# Patient Record
Sex: Female | Born: 1965 | Hispanic: No | Marital: Married | Smoking: Never smoker
Health system: Southern US, Community
[De-identification: ages and names within clinical notes are randomized; demographics above are authoritative.]

## PROBLEM LIST (undated history)

## (undated) DIAGNOSIS — M797 Fibromyalgia: Secondary | ICD-10-CM

## (undated) DIAGNOSIS — F419 Anxiety disorder, unspecified: Secondary | ICD-10-CM

## (undated) DIAGNOSIS — K259 Gastric ulcer, unspecified as acute or chronic, without hemorrhage or perforation: Secondary | ICD-10-CM

## (undated) HISTORY — DX: Fibromyalgia: M79.7

## (undated) HISTORY — DX: Gastric ulcer, unspecified as acute or chronic, without hemorrhage or perforation: K25.9

## (undated) HISTORY — DX: Anxiety disorder, unspecified: F41.9

## (undated) HISTORY — PX: TUBAL LIGATION: SHX77

---

## 2011-01-30 ENCOUNTER — Ambulatory Visit: Payer: Self-pay | Admitting: Internal Medicine

## 2011-07-09 ENCOUNTER — Ambulatory Visit: Payer: Self-pay | Admitting: Internal Medicine

## 2011-07-31 ENCOUNTER — Encounter: Payer: Self-pay | Admitting: Internal Medicine

## 2011-07-31 ENCOUNTER — Ambulatory Visit (INDEPENDENT_AMBULATORY_CARE_PROVIDER_SITE_OTHER): Payer: BC Managed Care – PPO | Admitting: Internal Medicine

## 2011-07-31 VITALS — BP 102/66 | HR 65 | Temp 98.1°F | Resp 20 | Ht 63.0 in | Wt 159.0 lb

## 2011-07-31 DIAGNOSIS — N952 Postmenopausal atrophic vaginitis: Secondary | ICD-10-CM

## 2011-07-31 DIAGNOSIS — F4320 Adjustment disorder, unspecified: Secondary | ICD-10-CM

## 2011-07-31 DIAGNOSIS — IMO0002 Reserved for concepts with insufficient information to code with codable children: Secondary | ICD-10-CM

## 2011-07-31 MED ORDER — ESTRADIOL 0.1 MG/GM VA CREA: 1.0000 g | TOPICAL_CREAM | Freq: Every day | VAGINAL | Status: AC

## 2011-07-31 NOTE — Patient Instructions (Addendum)
Apply estrace nightly x2 weeks, then 1-2 times per week. We will request previous office notes and lab work. Follow up in 1 month.

## 2011-07-31 NOTE — Progress Notes (Signed)
Subjective:    Patient ID: Carolyn Thomas, female    DOB: 01/23/1966, 45 y.o.   MRN: [id number]  HPI Carolyn Thomas is a 45 year old female who presents to establish care. Her main concern today is vaginal pain and dyspareunia. She reports that this has been present for several months. She was evaluated by her previous primary care physician and by OB/GYN in the past and she reports that examination, lab work, and ultrasound were all normal. She has been having irregular and very frequent periods sometimes up to 3 periods per month. She notes pain during sexual intercourse and reports that there is a laceration in her vagina which is painful at present. She occasionally has itching or clear discharge. She denies any pelvic pain, fever, chills, or dysuria.  Carolyn Thomas also reports a recent history of increased tension between herself and her husband, associated with relocation from [place] to [place]. She reports that her and her husband are currently seeking counseling and this is scheduled for next week.  Outpatient Encounter Prescriptions as of 07/31/2011  Medication Sig Dispense Refill  . estradiol (ESTRACE) 0.1 MG/GM vaginal cream Place 1 g vaginally daily.  42.5 g  1  . Multiple Vitamin (MULTIVITAMIN) capsule Take 1 capsule by mouth daily.        . rizatriptan (MAXALT) 10 MG tablet Take 10 mg by mouth as needed. May repeat in 2 hours if needed         Review of Systems  Constitutional: Negative for fever, chills, appetite change, fatigue and unexpected weight change.  HENT: Negative for ear pain, congestion, sore throat, trouble swallowing, neck pain, voice change and sinus pressure.   Eyes: Negative for visual disturbance.  Respiratory: Negative for cough, shortness of breath, wheezing and stridor.   Cardiovascular: Negative for chest pain, palpitations and leg swelling.  Gastrointestinal: Negative for nausea, vomiting, abdominal pain, diarrhea, constipation, blood in stool, abdominal  distention and anal bleeding.  Genitourinary: Positive for vaginal pain, menstrual problem (irregular and frequent) and dyspareunia. Negative for dysuria, flank pain, vaginal bleeding, vaginal discharge and pelvic pain.  Musculoskeletal: Negative for myalgias, arthralgias and gait problem.  Skin: Negative for color change and rash.  Neurological: Negative for dizziness and headaches.  Hematological: Negative for adenopathy. Does not bruise/bleed easily.  Psychiatric/Behavioral: Negative for suicidal ideas, sleep disturbance and dysphoric mood. The patient is not nervous/anxious.    BP 102/66  Pulse 65  Temp(Src) 98.1 F (36.7 C) (Oral)  Resp 20  Ht 5\' 3"  (1.6 m)  Wt 159 lb (72.122 kg)  BMI 28.17 kg/m2  SpO2 95%  LMP 07/16/2011     Objective:   Physical Exam  Constitutional: She is oriented to person, place, and time. She appears well-developed and well-nourished. No distress.  HENT:  Head: Normocephalic and atraumatic.  Right Ear: External ear normal.  Left Ear: External ear normal.  Nose: Nose normal.  Mouth/Throat: Oropharynx is clear and moist. No oropharyngeal exudate.  Eyes: Conjunctivae and EOM are normal. Pupils are equal, round, and reactive to light. Right eye exhibits no discharge. Left eye exhibits no discharge. No scleral icterus.  Neck: Normal range of motion. Neck supple. No tracheal deviation present. No thyromegaly present.  Cardiovascular: Normal rate, regular rhythm, normal heart sounds and intact distal pulses.  Exam reveals no gallop and no friction rub.   No murmur heard. Pulmonary/Chest: Effort normal and breath sounds normal. No respiratory distress. She has no wheezes. She has no rales. She exhibits no tenderness.  Abdominal:  Soft. She exhibits no distension. There is no tenderness.  Genitourinary:    Pelvic exam was performed with patient prone. There is injury on the left labia. There is tenderness around the vagina. No erythema or bleeding around the  vagina. There are signs of injury around the vagina. No vaginal discharge found.       Two abrasions, one at [facility] and one to the patient's left medial to the labia minora  Musculoskeletal: Normal range of motion. She exhibits no edema and no tenderness.  Lymphadenopathy:    She has no cervical adenopathy.  Neurological: She is alert and oriented to person, place, and time. No cranial nerve deficit. She exhibits normal muscle tone. Coordination normal.  Skin: Skin is warm and dry. No rash noted. She is not diaphoretic. No erythema. No pallor.  Psychiatric: She has a normal mood and affect. Her behavior is normal. Judgment and thought content normal.          Assessment & Plan:  1. Atrophic vaginitis with abrasion - Exam consistent with atrophic vaginitis with abrasion likely from intercourse.  Will request notes on previous evaluation and workup. Will start topical estrace cream nightly x2 weeks, then 1-2 times per week. We discussed potential risks of using estrogen preparation and potential alternative of oral contraceptive pill with combo estrogen and progesterone, however she reports she has not tolerated this well in the past and would prefer not to use topical preparations.  2. Adjustment disorder - Pt with some increase in martial stress associated with recent move to [place]. She has already set up counseling for her and her husband and I encouraged her to follow through with this.

## 2011-09-03 ENCOUNTER — Ambulatory Visit: Payer: BC Managed Care – PPO | Admitting: Internal Medicine

## 2011-09-03 DIAGNOSIS — Z0289 Encounter for other administrative examinations: Secondary | ICD-10-CM

## 2012-02-23 IMAGING — US US PELV - US TRANSVAGINAL
1 series · 14 of 25 positions shown · non-contrast
Comparison: none

REASON FOR EXAM: abnormal menstrual cycles
COMMENTS:

[Series 1: us pelv - us transvaginal · 0.33mm/px · 14 of 45 slices shown]
[im 1/45]
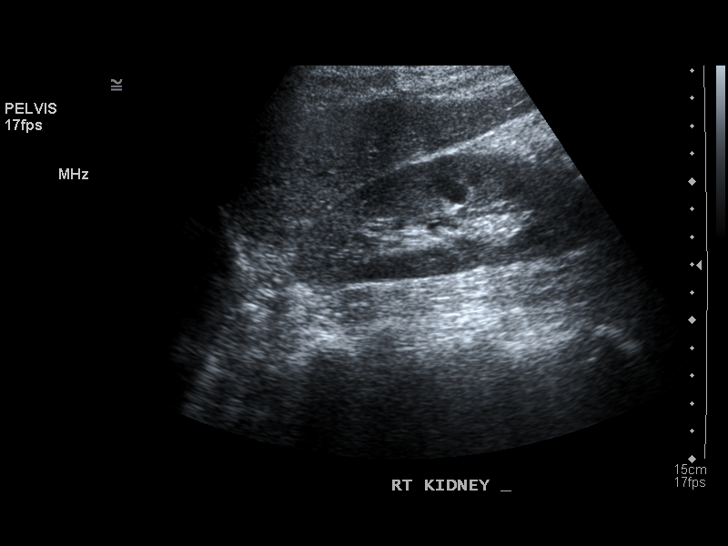
[im 4/45]
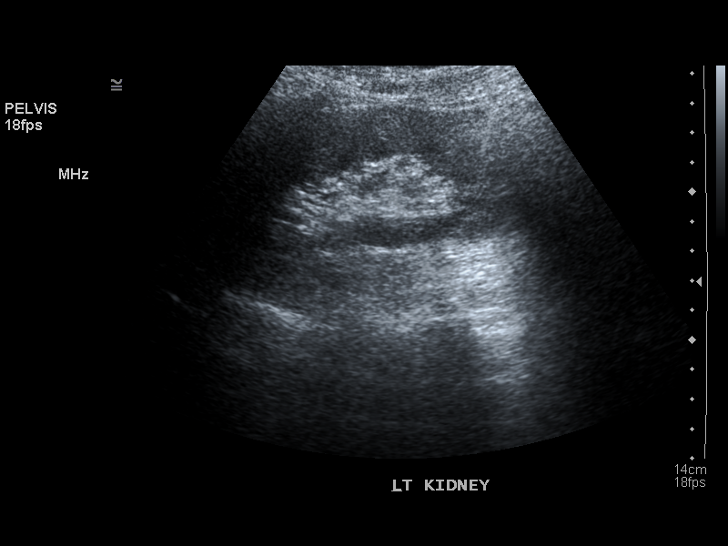
[im 8/45]
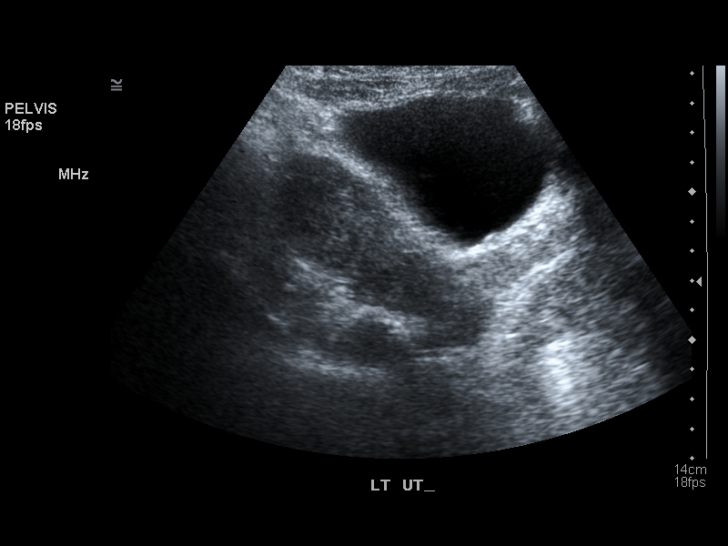
[im 12/45]
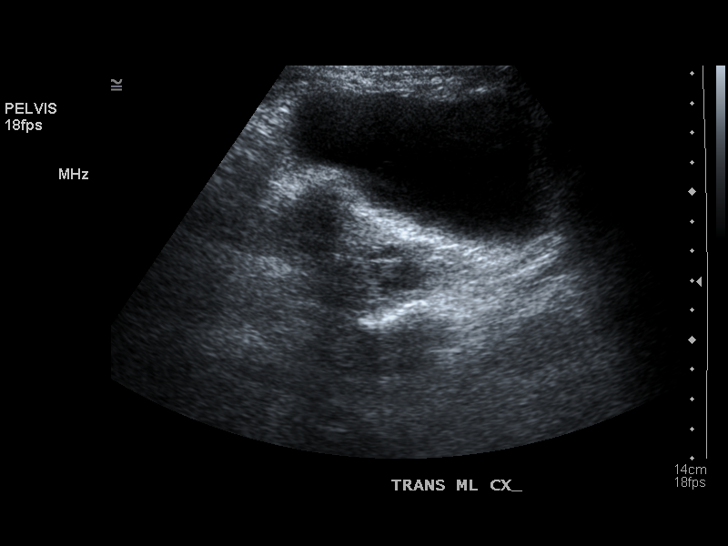
[im 15/45]
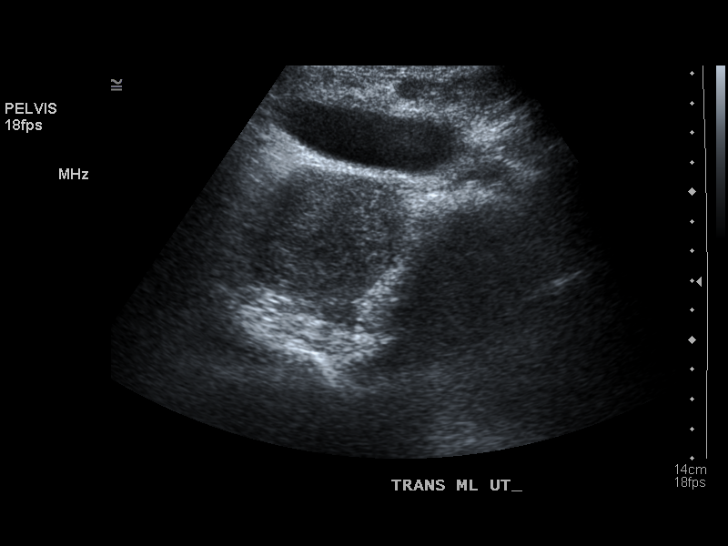
[im 17/45]
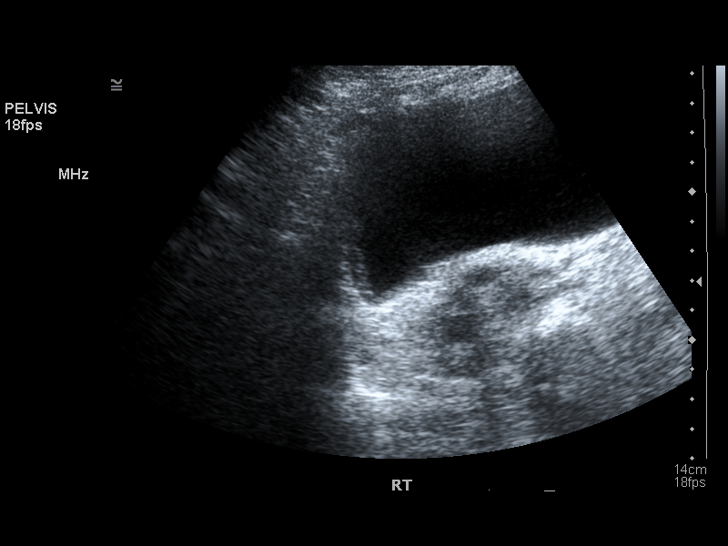
[im 21/45]
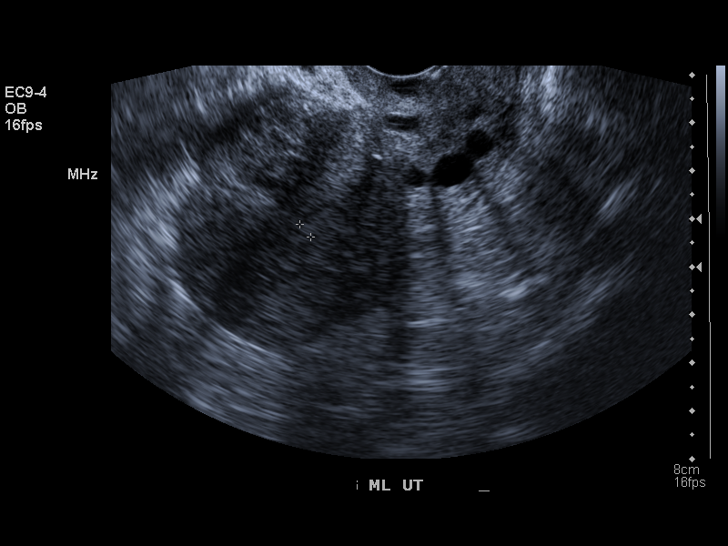
[im 24/45]
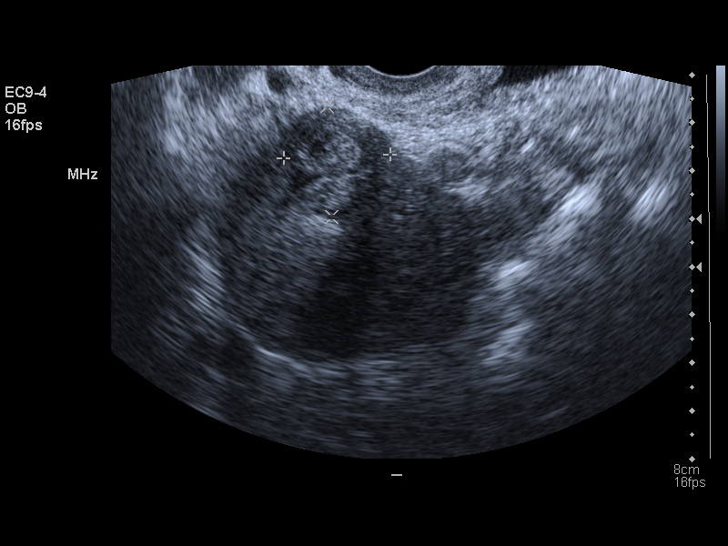
[im 28/45]
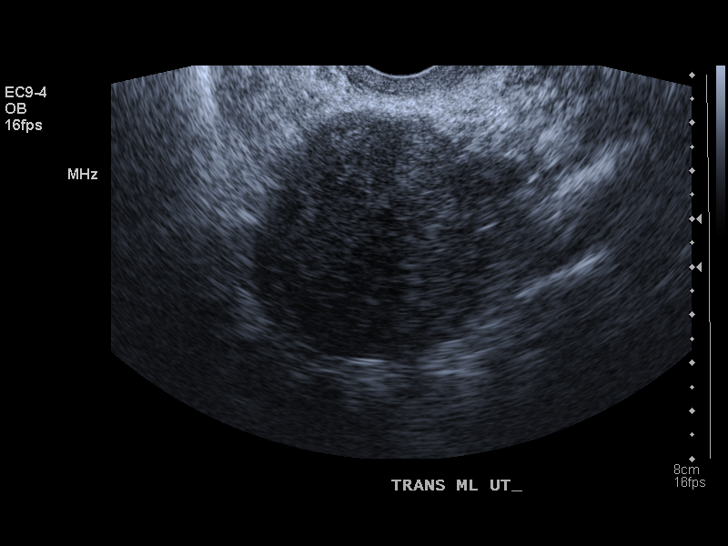
[im 30/45]
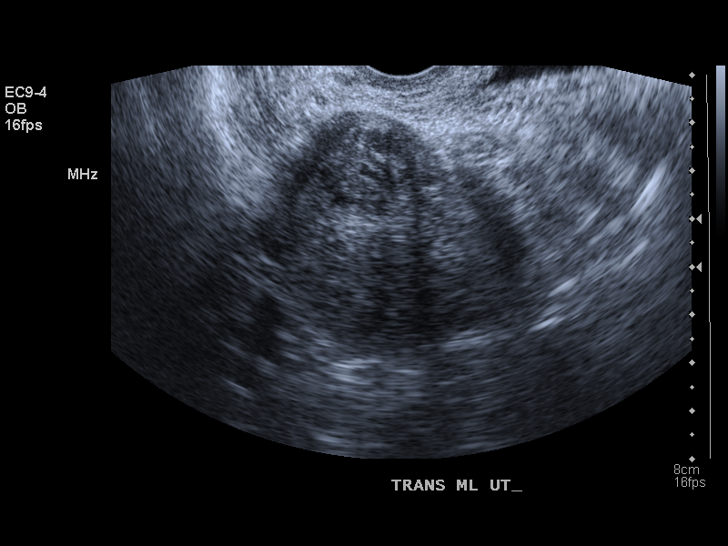
[im 34/45]
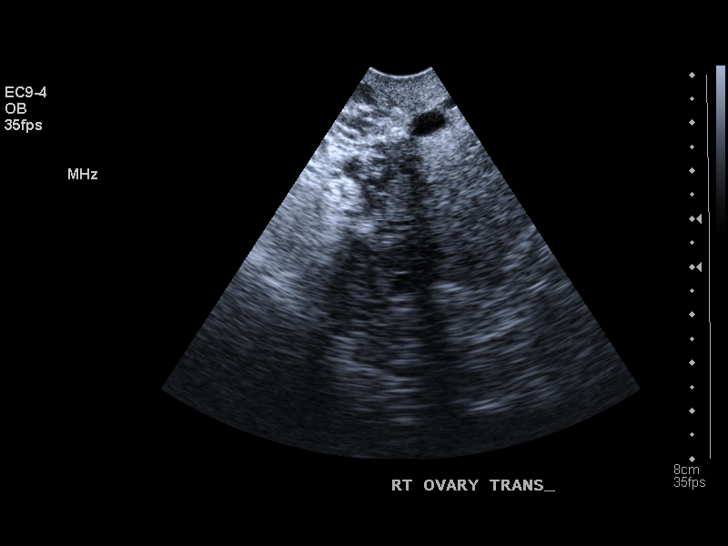
[im 37/45]
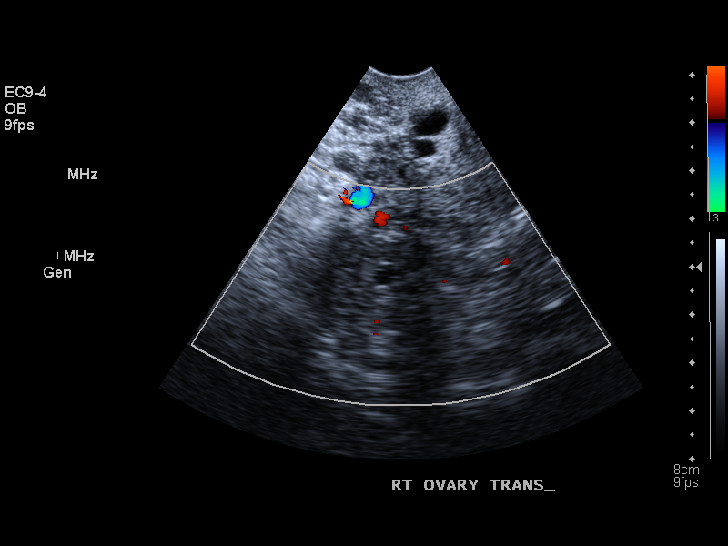
[im 41/45]
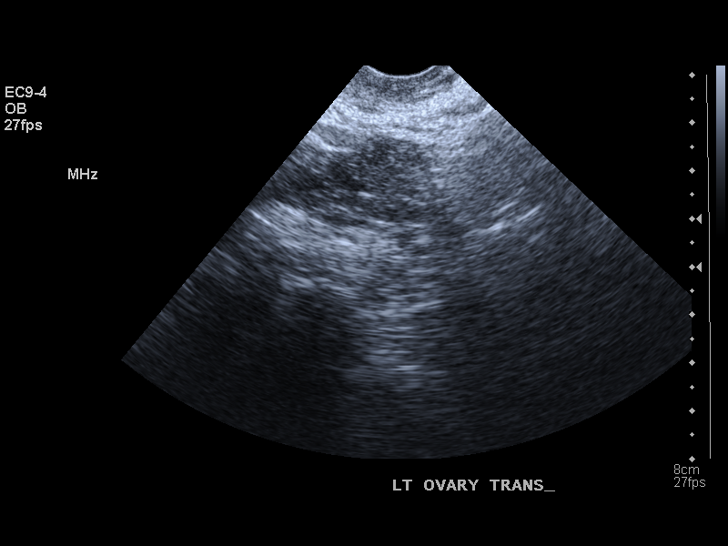
[im 45/45]
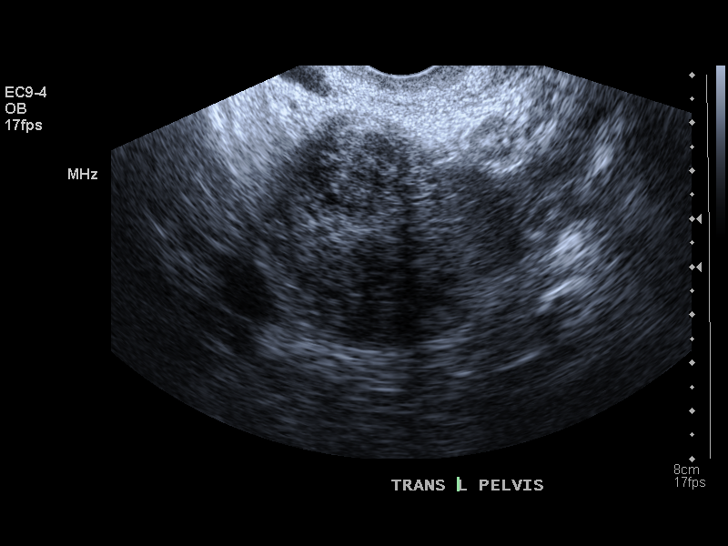

[14 of 25 positions shown; findings below may reference images not displayed]

PROCEDURE:     US  - US PELVIS EXAM W/TRANSVAGINAL  - January 30, 2011  [DATE]

RESULT:     Transabdominal and endovaginal ultrasound was performed. The
uterus measures 8.7 cm x 4.6 cm x 6.26 cm. There is a hypoechoic mass of the
body of the uterus anteriorly consistent with a uterine fibroid and
measuring 2.67 cm at maximum diameter. The endometrium measures 3.5 mm in
thickness. No abnormal adnexal masses are seen. The right and left ovaries
are visualized. The right ovary measures 2.54 cm at maximum diameter and the
left ovary measures 2.73 cm at maximum diameter. Vascular flow is observed
in each ovary on Doppler examination. The visualized portion of the urinary
bladder is normal in appearance. The kidneys show no hydronephrosis. No free
fluid is identified in the pelvis.
IMPRESSION: No significant abnormalities are identified.

## 2012-08-01 IMAGING — CR DG CHEST 2V
1 series · 2 of 2 positions shown · non-contrast
Comparison: none

REASON FOR EXAM: COUGH
COMMENTS:

PROCEDURE:     KDR - KDXR CHEST PA (OR AP) AND LAT  - July 09, 2011 [DATE]
RESULT:     The lung fields are clear. The heart, mediastinal and osseous
structures reveal no significant abnormalities.

[Series 1: view not recorded · 0.17mm/px · 2 of 2 slices shown]
[im 1/2]
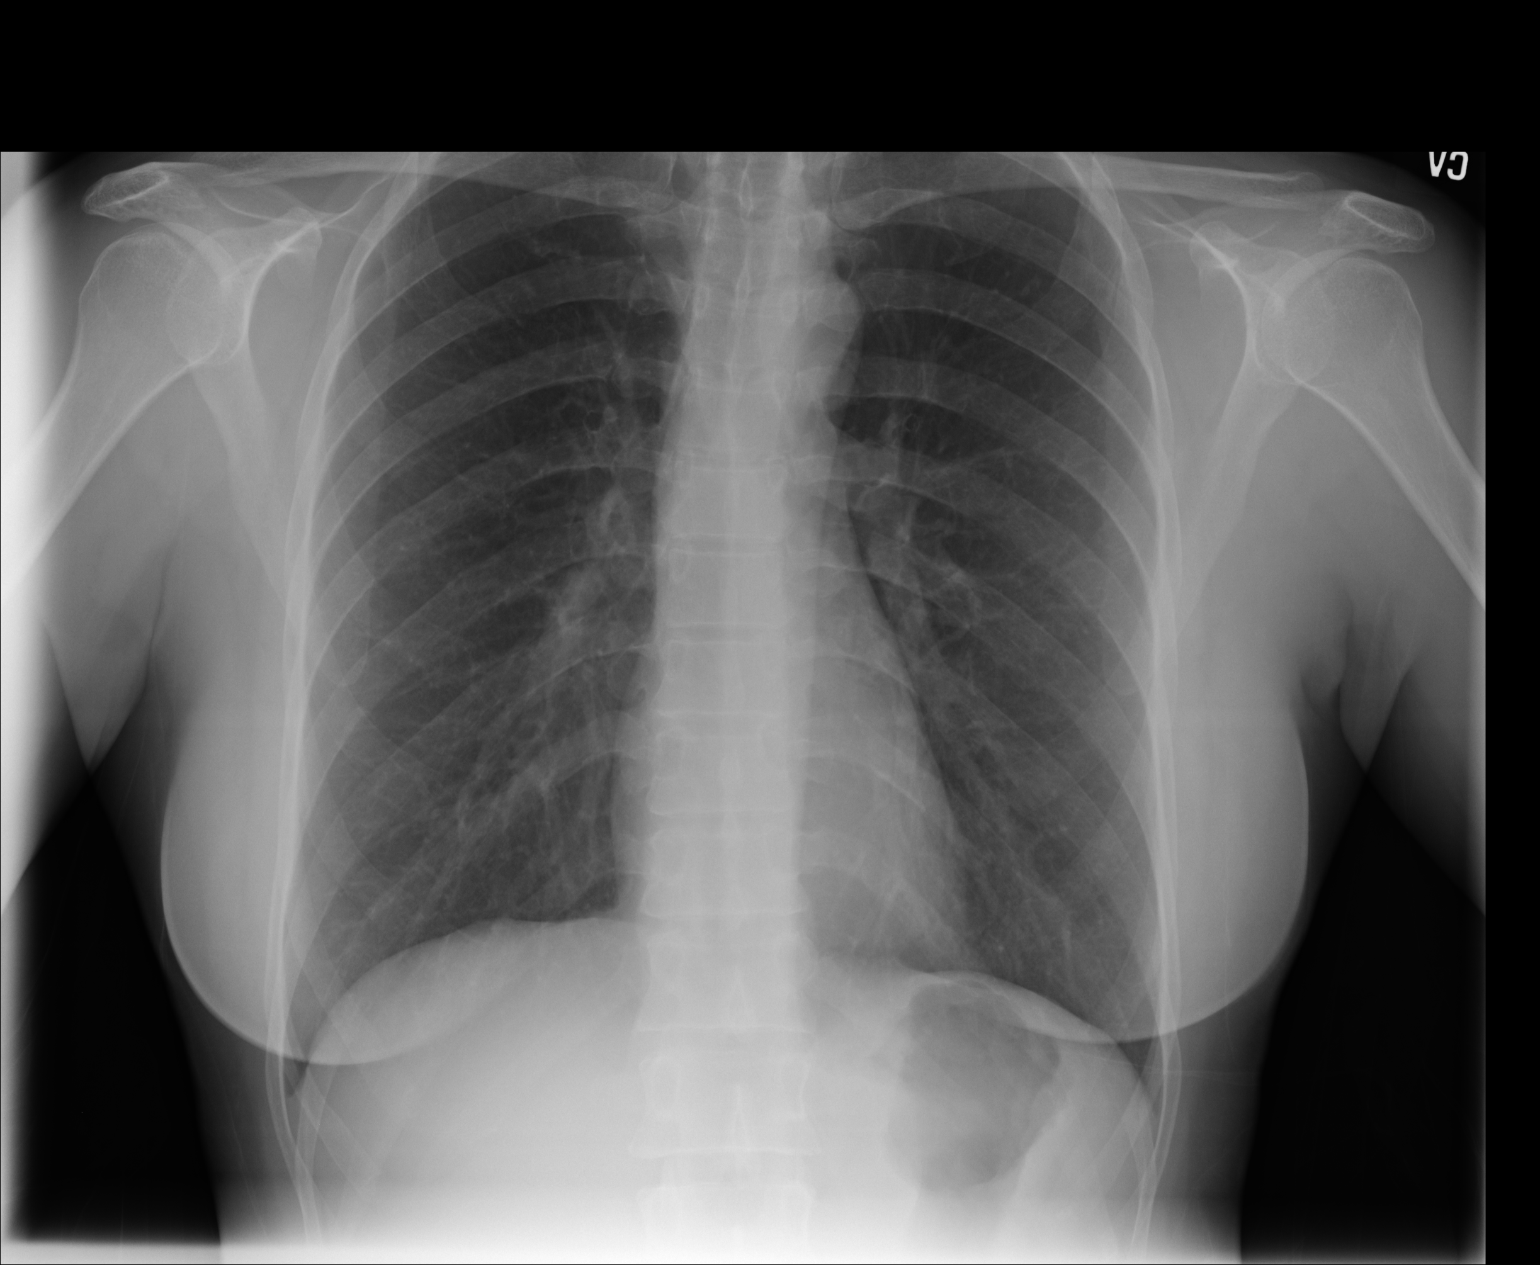
[im 2/2]
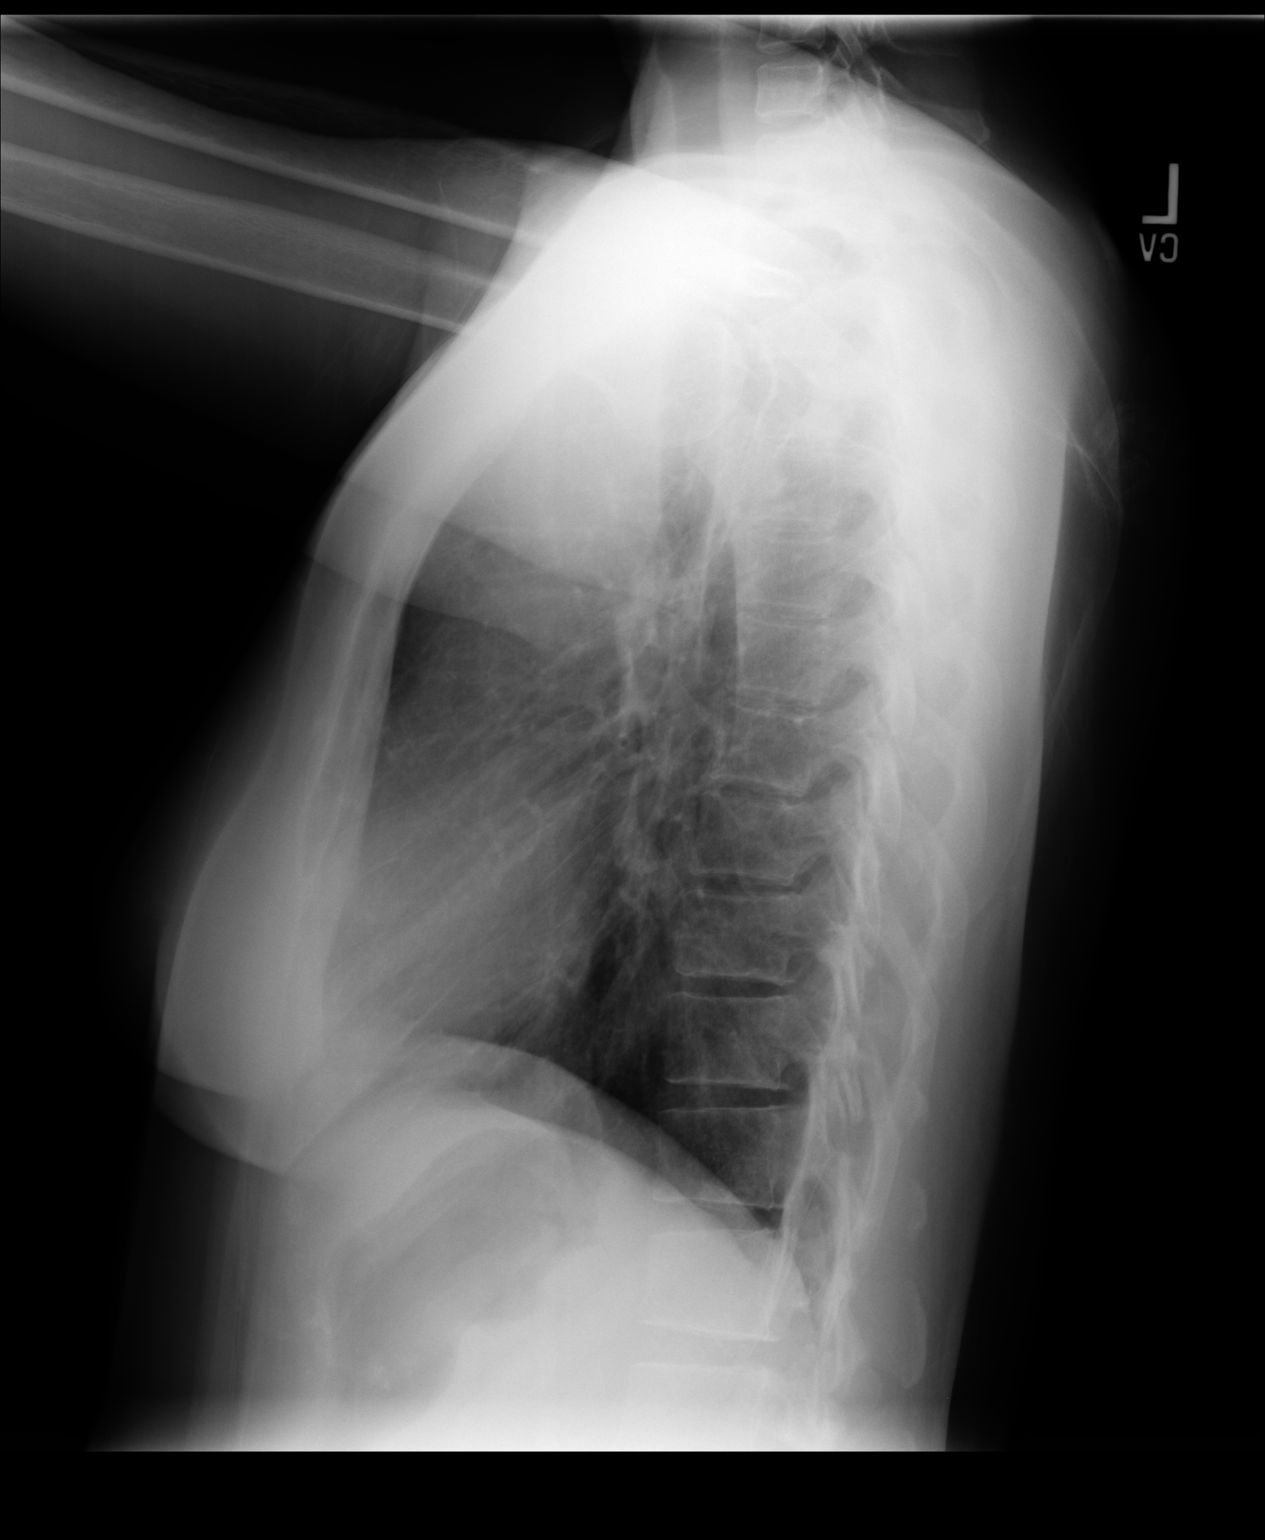

[2 of 2 positions shown; findings below may reference images not displayed]

IMPRESSION: No significant abnormalities are noted.

## 2012-09-08 ENCOUNTER — Encounter: Payer: Self-pay | Admitting: Internal Medicine

## 2012-09-08 ENCOUNTER — Ambulatory Visit (INDEPENDENT_AMBULATORY_CARE_PROVIDER_SITE_OTHER): Payer: BC Managed Care – PPO | Admitting: Internal Medicine

## 2012-09-08 VITALS — BP 102/60 | HR 62 | Ht 63.5 in | Wt 132.0 lb

## 2012-09-08 DIAGNOSIS — Z1239 Encounter for other screening for malignant neoplasm of breast: Secondary | ICD-10-CM

## 2012-09-08 DIAGNOSIS — Z13220 Encounter for screening for lipoid disorders: Secondary | ICD-10-CM

## 2012-09-08 DIAGNOSIS — M791 Myalgia, unspecified site: Secondary | ICD-10-CM

## 2012-09-08 DIAGNOSIS — Z0000 Encounter for general adult medical examination without abnormal findings: Secondary | ICD-10-CM

## 2012-09-08 DIAGNOSIS — IMO0001 Reserved for inherently not codable concepts without codable children: Secondary | ICD-10-CM

## 2012-09-08 LAB — CBC WITH DIFFERENTIAL/PLATELET
Eosinophils Relative: 2.8 % (ref 0.0–5.0)
HCT: 40.6 % (ref 36.0–46.0)
Hemoglobin: 13.4 g/dL (ref 12.0–15.0)
Lymphs Abs: 2.2 10*3/uL (ref 0.7–4.0)
MCV: 89.7 fl (ref 78.0–100.0)
Monocytes Absolute: 0.3 10*3/uL (ref 0.1–1.0)
Monocytes Relative: 5.3 % (ref 3.0–12.0)
Neutro Abs: 3.5 10*3/uL (ref 1.4–7.7)
Platelets: 253 10*3/uL (ref 150.0–400.0)
RDW: 13.5 % (ref 11.5–14.6)
WBC: 6.3 10*3/uL (ref 4.5–10.5)

## 2012-09-08 LAB — LDL CHOLESTEROL, DIRECT: Direct LDL: 138.2 mg/dL

## 2012-09-08 LAB — COMPREHENSIVE METABOLIC PANEL
ALT: 16 U/L (ref 0–35)
Alkaline Phosphatase: 66 U/L (ref 39–117)
Creatinine, Ser: 0.7 mg/dL (ref 0.4–1.2)
GFR: 95.73 mL/min (ref >60.00)
Sodium: 137 mEq/L (ref 135–145)
Total Bilirubin: 1.1 mg/dL (ref 0.3–1.2)
Total Protein: 6.7 g/dL (ref 6.0–8.3)

## 2012-09-08 LAB — CK: Total CK: 122 U/L (ref 7–177)

## 2012-09-08 LAB — LIPID PANEL
HDL: 55.6 mg/dL (ref >39.00)
Total CHOL/HDL Ratio: 4
Triglycerides: 97 mg/dL (ref 0.0–149.0)

## 2012-09-08 NOTE — Assessment & Plan Note (Addendum)
General medical exam normal today including breast exam. PAP deferred as recently completed at previous PCP and normal per pt. Will request records on this.  Flu vaccine declined today.  Mammogram ordered. Will check labs including CBC, CMP, lipids. Follow up 1 year and prn.

## 2012-09-08 NOTE — Assessment & Plan Note (Signed)
Mild myalgia noted in hips and thighs. No new medications or supplements noted. Exam normal. Will check CBC, CMP, total CK, TSH, B12 with labs. No arthralgia to suggest Lupus, RA.  Will continue to monitor. Pt will call if symptoms worsening or not improving.

## 2012-09-08 NOTE — Progress Notes (Signed)
Subjective:    Patient ID: Carolyn Thomas, female    DOB: [birth year], 46 y.o.   MRN: [id number]  HPI 46YO female presents for annual exam. Doing well. Only concern today is occasional muscle pain in bilateral hips and thighs. Does not take any medication for this.  Pain described as aching. Not associated with any particular activity. Not worsened with activity.  No weakness noted in legs or arms. No joint pain or skin changes.  Otherwise, feeling well. No recent issues with headaches, has not taken Maxalt since last visit.  Notes that PAP is UTD and was normal last year. Mammogram due.  Outpatient Encounter Prescriptions as of 09/08/2012  Medication Sig Dispense Refill  . Multiple Vitamin (MULTIVITAMIN) capsule Take 1 capsule by mouth daily.        . rizatriptan (MAXALT) 10 MG tablet Take 10 mg by mouth as needed. May repeat in 2 hours if needed        BP 102/60  Pulse 62  Ht 5' 3.5" (1.613 m)  Wt 132 lb (59.875 kg)  BMI 23.02 kg/m2  SpO2 98%  Review of Systems  Constitutional: Negative for fever, chills, appetite change, fatigue and unexpected weight change.  HENT: Negative for ear pain, congestion, sore throat, trouble swallowing, neck pain, voice change and sinus pressure.   Eyes: Negative for visual disturbance.  Respiratory: Negative for cough, shortness of breath, wheezing and stridor.   Cardiovascular: Negative for chest pain, palpitations and leg swelling.  Gastrointestinal: Negative for nausea, vomiting, abdominal pain, diarrhea, constipation, blood in stool, abdominal distention and anal bleeding.  Genitourinary: Negative for dysuria and flank pain.  Musculoskeletal: Positive for myalgias. Negative for arthralgias and gait problem.  Skin: Negative for color change and rash.  Neurological: Negative for dizziness, seizures, weakness, numbness and headaches.  Hematological: Negative for adenopathy. Does not bruise/bleed easily.  Psychiatric/Behavioral: Negative for suicidal  ideas, disturbed wake/sleep cycle and dysphoric mood. The patient is not nervous/anxious.        Objective:   Physical Exam  Constitutional: She is oriented to person, place, and time. She appears well-developed and well-nourished. No distress.  HENT:  Head: Normocephalic and atraumatic.  Right Ear: External ear normal.  Left Ear: External ear normal.  Nose: Nose normal.  Mouth/Throat: Oropharynx is clear and moist. No oropharyngeal exudate.  Eyes: Conjunctivae normal are normal. Pupils are equal, round, and reactive to light. Right eye exhibits no discharge. Left eye exhibits no discharge. No scleral icterus.  Neck: Normal range of motion. Neck supple. No tracheal deviation present. No thyromegaly present.  Cardiovascular: Normal rate, regular rhythm, normal heart sounds and intact distal pulses.  Exam reveals no gallop and no friction rub.   No murmur heard. Pulmonary/Chest: Effort normal and breath sounds normal. No respiratory distress. She has no wheezes. She has no rales. She exhibits no tenderness. Right breast exhibits no inverted nipple, no mass, no nipple discharge, no skin change and no tenderness. Left breast exhibits no inverted nipple, no mass, no nipple discharge, no skin change and no tenderness. Breasts are symmetrical.  Abdominal: Soft. Bowel sounds are normal. She exhibits no distension. There is no tenderness.  Musculoskeletal: Normal range of motion. She exhibits no edema and no tenderness.  Lymphadenopathy:    She has no cervical adenopathy.  Neurological: She is alert and oriented to person, place, and time. No cranial nerve deficit. She exhibits normal muscle tone. Coordination normal.  Skin: Skin is warm and dry. No rash noted. She is not diaphoretic.  No erythema. No pallor.  Psychiatric: She has a normal mood and affect. Her behavior is normal. Judgment and thought content normal.          Assessment & Plan:

## 2012-09-11 ENCOUNTER — Telehealth: Payer: Self-pay | Admitting: Internal Medicine

## 2012-09-11 DIAGNOSIS — M791 Myalgia, unspecified site: Secondary | ICD-10-CM

## 2012-09-11 NOTE — Telephone Encounter (Signed)
Referral placed to rheumatology for muscle pain

## 2012-09-25 ENCOUNTER — Telehealth: Payer: Self-pay | Admitting: Internal Medicine

## 2012-09-25 NOTE — Telephone Encounter (Signed)
Left message on cell phone voicemail for patient to return call. 

## 2012-09-25 NOTE — Telephone Encounter (Signed)
Mammogram performed on 09/23/2012 needed additional views. Can you please make sure this has been set up?

## 2012-09-28 ENCOUNTER — Telehealth: Payer: Self-pay | Admitting: Internal Medicine

## 2012-09-28 NOTE — Telephone Encounter (Signed)
Patient advised via telephone, she stated that she will call [facility] today to schedule additional views.  I advised patient to call our office once the additional views have been done to schedule a f/u appt with Dr. Dan Humphreys.  I provided patient the phone number to [facility] [phone].

## 2012-09-28 NOTE — Telephone Encounter (Signed)
Compression views on mammogram were normal. Recommend follow up mammogram in 1 year. No need for appointment to discuss this unless pt has questions

## 2012-09-28 NOTE — Telephone Encounter (Signed)
Left message on cell phone voicemail for patient to return call. 

## 2012-09-29 NOTE — Telephone Encounter (Signed)
Notified patient of normal mammogram results.

## 2012-10-05 ENCOUNTER — Encounter: Payer: Self-pay | Admitting: Internal Medicine

## 2012-10-12 ENCOUNTER — Encounter: Payer: Self-pay | Admitting: Internal Medicine

## 2013-09-10 ENCOUNTER — Encounter: Payer: Self-pay | Admitting: Internal Medicine

## 2013-09-10 ENCOUNTER — Encounter (INDEPENDENT_AMBULATORY_CARE_PROVIDER_SITE_OTHER): Payer: Self-pay

## 2013-09-10 ENCOUNTER — Ambulatory Visit (INDEPENDENT_AMBULATORY_CARE_PROVIDER_SITE_OTHER): Payer: BC Managed Care – PPO | Admitting: Internal Medicine

## 2013-09-10 VITALS — BP 100/72 | HR 78 | Temp 98.3°F | Ht 63.25 in | Wt 134.0 lb

## 2013-09-10 DIAGNOSIS — L989 Disorder of the skin and subcutaneous tissue, unspecified: Secondary | ICD-10-CM

## 2013-09-10 DIAGNOSIS — Z0000 Encounter for general adult medical examination without abnormal findings: Secondary | ICD-10-CM

## 2013-09-10 LAB — LIPID PANEL
Cholesterol: 200 mg/dL (ref 0–200)
LDL Cholesterol: 122 mg/dL — ABNORMAL HIGH (ref 0–99)
Total CHOL/HDL Ratio: 3 Ratio
Triglycerides: 54 mg/dL (ref <150)
VLDL: 11 mg/dL (ref 0–40)

## 2013-09-10 LAB — CBC WITH DIFFERENTIAL/PLATELET
HCT: 40.6 % (ref 36.0–46.0)
Hemoglobin: 13.9 g/dL (ref 12.0–15.0)
Lymphocytes Relative: 38 % (ref 12–46)
Lymphs Abs: 3.4 10*3/uL (ref 0.7–4.0)
Monocytes Relative: 4 % (ref 3–12)
Neutro Abs: 5.1 10*3/uL (ref 1.7–7.7)
Neutrophils Relative %: 56 % (ref 43–77)
RBC: 4.72 MIL/uL (ref 3.87–5.11)

## 2013-09-10 LAB — COMPREHENSIVE METABOLIC PANEL
Albumin: 4.4 g/dL (ref 3.5–5.2)
BUN: 16 mg/dL (ref 6–23)
Calcium: 9.7 mg/dL (ref 8.4–10.5)
Chloride: 102 mEq/L (ref 96–112)
Creat: 0.74 mg/dL (ref 0.50–1.10)
Glucose, Bld: 68 mg/dL — ABNORMAL LOW (ref 70–99)
Potassium: 4.4 mEq/L (ref 3.5–5.3)

## 2013-09-10 LAB — TSH: TSH: 2.254 u[IU]/mL (ref 0.350–4.500)

## 2013-09-11 DIAGNOSIS — Z0000 Encounter for general adult medical examination without abnormal findings: Secondary | ICD-10-CM | POA: Insufficient documentation

## 2013-09-11 DIAGNOSIS — L989 Disorder of the skin and subcutaneous tissue, unspecified: Secondary | ICD-10-CM | POA: Insufficient documentation

## 2013-09-11 LAB — VITAMIN D 25 HYDROXY (VIT D DEFICIENCY, FRACTURES): Vit D, 25-Hydroxy: 29 ng/mL — ABNORMAL LOW (ref 30–89)

## 2013-09-11 NOTE — Assessment & Plan Note (Addendum)
General medical exam including breast exam normal today. Pap and pelvic deferred as reportedly normal 2012. Will request records on Pap smear from her former PCP. Mammogram ordered. Will check labs today including CBC, CMP, lipid profile, TSH, vitamin D. Flu vaccine declined.

## 2013-09-11 NOTE — Progress Notes (Signed)
Subjective:    Patient ID: Carolyn Thomas, female    DOB: 11/11/1966, 47 y.o.   MRN: [id number]  HPI 47 year old female presents for annual exam. She reports she is feeling well. She has been following a healthy diet and gets regular physical activity. She denies any repeat episodes of migraine headaches since her last visit. She declines flu shot today. She is up-to-date on Pap smear which she reports was normal in 2012. She is due for mammogram. She is concerned about a new skin lesion on her right foot. She reports this first appeared a few months ago. It appears to be dark black especially when it is wet after showering. It is not painful. It has increased in size.  Outpatient Encounter Prescriptions as of 09/10/2013  Medication Sig  . Multiple Vitamin (MULTIVITAMIN) capsule Take 1 capsule by mouth daily.     BP 100/72  Pulse 78  Temp(Src) 98.3 F (36.8 C) (Oral)  Ht 5' 3.25" (1.607 m)  Wt 134 lb (60.782 kg)  BMI 23.54 kg/m2  SpO2 97%  Review of Systems  Constitutional: Negative for fever, chills, appetite change, fatigue and unexpected weight change.  HENT: Negative for congestion, ear pain, sinus pressure, sore throat, trouble swallowing and voice change.   Eyes: Negative for visual disturbance.  Respiratory: Negative for cough, shortness of breath, wheezing and stridor.   Cardiovascular: Negative for chest pain, palpitations and leg swelling.  Gastrointestinal: Negative for nausea, vomiting, abdominal pain, diarrhea, constipation, blood in stool, abdominal distention and anal bleeding.  Genitourinary: Negative for dysuria and flank pain.  Musculoskeletal: Negative for arthralgias, gait problem, myalgias and neck pain.  Skin: Negative for color change and rash.  Neurological: Negative for dizziness and headaches.  Hematological: Negative for adenopathy. Does not bruise/bleed easily.  Psychiatric/Behavioral: Negative for suicidal ideas, sleep disturbance and dysphoric mood. The  patient is not nervous/anxious.        Objective:   Physical Exam  Constitutional: She is oriented to person, place, and time. She appears well-developed and well-nourished. No distress.  HENT:  Head: Normocephalic and atraumatic.  Right Ear: External ear normal.  Left Ear: External ear normal.  Nose: Nose normal.  Mouth/Throat: Oropharynx is clear and moist. No oropharyngeal exudate.  Eyes: Conjunctivae are normal. Pupils are equal, round, and reactive to light. Right eye exhibits no discharge. Left eye exhibits no discharge. No scleral icterus.  Neck: Normal range of motion. Neck supple. No tracheal deviation present. No thyromegaly present.  Cardiovascular: Normal rate, regular rhythm, normal heart sounds and intact distal pulses.  Exam reveals no gallop and no friction rub.   No murmur heard. Pulmonary/Chest: Effort normal and breath sounds normal. No accessory muscle usage. Not tachypneic. No respiratory distress. She has no decreased breath sounds. She has no wheezes. She has no rales. She exhibits no tenderness. Right breast exhibits no inverted nipple, no mass, no nipple discharge, no skin change and no tenderness. Left breast exhibits no inverted nipple, no mass, no nipple discharge, no skin change and no tenderness. Breasts are symmetrical.  Abdominal: Soft. Bowel sounds are normal. She exhibits no distension and no mass. There is no tenderness. There is no rebound and no guarding.  Musculoskeletal: Normal range of motion. She exhibits no edema and no tenderness.  Lymphadenopathy:    She has no cervical adenopathy.  Neurological: She is alert and oriented to person, place, and time. No cranial nerve deficit. She exhibits normal muscle tone. Coordination normal.  Skin: Skin is warm and dry.  Lesion noted. No rash noted. She is not diaphoretic. No erythema. No pallor.     Psychiatric: She has a normal mood and affect. Her behavior is normal. Judgment and thought content normal.           Assessment & Plan:

## 2013-09-11 NOTE — Assessment & Plan Note (Signed)
Skin lesion on right foot most consistent with seborrheic keratosis. Will set up dermatology evaluation for overall skin exam and possible removal of SK.

## 2013-09-13 ENCOUNTER — Encounter: Payer: Self-pay | Admitting: *Deleted

## 2013-09-15 ENCOUNTER — Telehealth: Payer: Self-pay | Admitting: Internal Medicine

## 2013-09-15 ENCOUNTER — Encounter: Payer: Self-pay | Admitting: Emergency Medicine

## 2013-09-15 NOTE — Telephone Encounter (Signed)
Left message to call back  

## 2013-09-15 NOTE — Telephone Encounter (Signed)
Having a lot of aches and pains not resolved with tylenol.  States right now she is in a lot of pain.  Was recently here 10/31 but forgot to mention this.  Asking if Dr. Dan Humphreys will give her advice over the phone, call something in for her, or if she will have to come back in and be seen.

## 2013-09-16 NOTE — Telephone Encounter (Signed)
Spoke with patient she state she has been having a lot of body aches and pain. Informed patient she would need to schedule an appointment to discuss. Per patient she will call back to schedule.

## 2013-11-19 ENCOUNTER — Telehealth: Payer: Self-pay | Admitting: Internal Medicine

## 2013-11-19 NOTE — Telephone Encounter (Signed)
Spoke with patient, informed her we do not have any immunizations on file for her. Patient verbally agreed and stated she tracked some down from her previous physicians.

## 2013-11-19 NOTE — Telephone Encounter (Signed)
The patient is needing her immunization records ASAP for school on Monday. She is needing a call back within an hour.

## 2013-12-14 ENCOUNTER — Telehealth: Payer: Self-pay | Admitting: Internal Medicine

## 2013-12-14 NOTE — Telephone Encounter (Signed)
Pt called to get an appointment to get her immunizations °She need mmr tdp °Is it ok to make appointment °

## 2013-12-15 NOTE — Telephone Encounter (Signed)
Will this be ok? 

## 2013-12-15 NOTE — Telephone Encounter (Signed)
That is fine, however she likely already had MMR. We can send labs to check immunity if she would like prior to vaccine.

## 2013-12-16 NOTE — Telephone Encounter (Signed)
Carolyn Thomas has already scheduled her an appointment.

## 2013-12-16 NOTE — Telephone Encounter (Signed)
Carolyn RegalCarol  Do you want me to make nurse visit or appointment with dr walker

## 2013-12-25 ENCOUNTER — Ambulatory Visit: Payer: Self-pay | Admitting: Anesthesiology

## 2014-01-17 ENCOUNTER — Ambulatory Visit: Payer: BC Managed Care – PPO

## 2015-03-02 ENCOUNTER — Encounter: Payer: Self-pay | Admitting: Internal Medicine

## 2015-03-02 ENCOUNTER — Ambulatory Visit (INDEPENDENT_AMBULATORY_CARE_PROVIDER_SITE_OTHER): Payer: BLUE CROSS/BLUE SHIELD | Admitting: Internal Medicine

## 2015-03-02 VITALS — BP 100/68 | HR 70 | Temp 98.4°F | Resp 14 | Ht 63.25 in | Wt 132.0 lb

## 2015-03-02 DIAGNOSIS — M791 Myalgia: Secondary | ICD-10-CM | POA: Diagnosis not present

## 2015-03-02 DIAGNOSIS — IMO0001 Reserved for inherently not codable concepts without codable children: Secondary | ICD-10-CM

## 2015-03-02 DIAGNOSIS — M609 Myositis, unspecified: Secondary | ICD-10-CM

## 2015-03-02 LAB — CBC WITH DIFFERENTIAL/PLATELET
Basophils Absolute: 0 10*3/uL (ref 0.0–0.1)
Basophils Relative: 0.6 % (ref 0.0–3.0)
EOS ABS: 0.2 10*3/uL (ref 0.0–0.7)
Eosinophils Relative: 2.8 % (ref 0.0–5.0)
HEMATOCRIT: 42.6 % (ref 36.0–46.0)
Hemoglobin: 14.5 g/dL (ref 12.0–15.0)
Lymphocytes Relative: 40.5 % (ref 12.0–46.0)
Lymphs Abs: 3.1 10*3/uL (ref 0.7–4.0)
MCHC: 34.1 g/dL (ref 30.0–36.0)
MCV: 85.5 fl (ref 78.0–100.0)
MONO ABS: 0.3 10*3/uL (ref 0.1–1.0)
Monocytes Relative: 4.5 % (ref 3.0–12.0)
NEUTROS PCT: 51.6 % (ref 43.0–77.0)
Neutro Abs: 4 10*3/uL (ref 1.4–7.7)
Platelets: 269 10*3/uL (ref 150.0–400.0)
RBC: 4.99 Mil/uL (ref 3.87–5.11)
RDW: 12.8 % (ref 11.5–15.5)
WBC: 7.8 10*3/uL (ref 4.0–10.5)

## 2015-03-02 LAB — VITAMIN B12: Vitamin B-12: 336 pg/mL (ref 211–911)

## 2015-03-02 LAB — T4, FREE: Free T4: 0.8 ng/dL (ref 0.60–1.60)

## 2015-03-02 LAB — COMPREHENSIVE METABOLIC PANEL
ALT: 15 U/L (ref 0–35)
AST: 25 U/L (ref 0–37)
Albumin: 4.4 g/dL (ref 3.5–5.2)
Alkaline Phosphatase: 84 U/L (ref 39–117)
BILIRUBIN TOTAL: 0.8 mg/dL (ref 0.2–1.2)
BUN: 16 mg/dL (ref 6–23)
CO2: 30 meq/L (ref 19–32)
Calcium: 9.6 mg/dL (ref 8.4–10.5)
Chloride: 103 mEq/L (ref 96–112)
Creatinine, Ser: 0.7 mg/dL (ref 0.40–1.20)
GFR: 94.72 mL/min (ref >60.00)
GLUCOSE: 82 mg/dL (ref 70–99)
Potassium: 4.6 mEq/L (ref 3.5–5.1)
Sodium: 138 mEq/L (ref 135–145)
Total Protein: 6.8 g/dL (ref 6.0–8.3)

## 2015-03-02 LAB — SEDIMENTATION RATE: Sed Rate: 10 mm/hr (ref 0–22)

## 2015-03-02 LAB — CK: Total CK: 167 U/L (ref 7–177)

## 2015-03-02 LAB — C-REACTIVE PROTEIN: CRP: 0.1 mg/dL — ABNORMAL LOW (ref 0.5–20.0)

## 2015-03-02 LAB — VITAMIN D 25 HYDROXY (VIT D DEFICIENCY, FRACTURES): VITD: 27.78 ng/mL — ABNORMAL LOW (ref 30.00–100.00)

## 2015-03-02 LAB — TSH: TSH: 1.66 u[IU]/mL (ref 0.35–4.50)

## 2015-03-02 MED ORDER — DULOXETINE HCL 20 MG PO CPEP: 20.0000 mg | ORAL_CAPSULE | Freq: Every day | ORAL | Status: DC

## 2015-03-02 NOTE — Assessment & Plan Note (Signed)
Recent diffuse myalgia. Exam is normal. Will send basic labs as ordered. Discussed that anxiety may be playing a role in symptoms. Will start Cymbalta 20mg  daily. Follow up in 4 weeks and prn.

## 2015-03-02 NOTE — Progress Notes (Signed)
Pre visit review using our clinic review tool, if applicable. No additional management support is needed unless otherwise documented below in the visit note. 

## 2015-03-02 NOTE — Patient Instructions (Signed)
Start Cymbalta 20mg  daily.  Labs today.

## 2015-03-02 NOTE — Progress Notes (Signed)
Subjective:    Patient ID: Carolyn Thomas, female    DOB: 29-May-1966, 49 y.o.   MRN: [id number]  HPI  49YO female presents for acute visit.  Muscle cramps - Physically active. Muscle cramps for years, but much worse last 2-3 months. Mostly located in thighs, calves, shoulders. Feels like a "truck hit her." Symptoms improved by wrapping legs and arms with scarves, or generally applying pressure to the area. Stopped all vitamin supplements. No improvement. Occasionally takes Tylenol at night for pain with some improvement. No rash. No fever, chills. No sore throat. No recent travel. Mother died in 11-08-23. Notes some anxiety about children being away at school. Reports her husband thinks that anxiety plays a role.  Wt Readings from Last 3 Encounters:  03/02/15 132 lb (59.875 kg)  09/10/13 134 lb (60.782 kg)  09/08/12 132 lb (59.875 kg)     Past medical, surgical, family and social history per today's encounter.  Review of Systems  Constitutional: Positive for fatigue. Negative for fever, chills, appetite change and unexpected weight change.  Eyes: Negative for visual disturbance.  Respiratory: Negative for shortness of breath.   Cardiovascular: Negative for chest pain and leg swelling.  Gastrointestinal: Negative for nausea, vomiting, abdominal pain, diarrhea, constipation and blood in stool.  Musculoskeletal: Positive for myalgias. Negative for back pain and arthralgias.  Skin: Negative for color change and rash.  Hematological: Negative for adenopathy. Does not bruise/bleed easily.  Psychiatric/Behavioral: Positive for sleep disturbance and dysphoric mood. Negative for suicidal ideas. The patient is nervous/anxious.        Objective:    BP 100/68 mmHg  Pulse 70  Temp(Src) 98.4 F (36.9 C) (Oral)  Resp 14  Ht 5' 3.25" (1.607 m)  Wt 132 lb (59.875 kg)  BMI 23.19 kg/m2  SpO2 98% Physical Exam  Constitutional: She is oriented to person, place, and time. She appears  well-developed and well-nourished. No distress.  HENT:  Head: Normocephalic and atraumatic.  Right Ear: External ear normal.  Left Ear: External ear normal.  Nose: Nose normal.  Mouth/Throat: Oropharynx is clear and moist. No oropharyngeal exudate.  Eyes: Conjunctivae and EOM are normal. Pupils are equal, round, and reactive to light. Right eye exhibits no discharge. Left eye exhibits no discharge. No scleral icterus.  Neck: Normal range of motion. Neck supple. No tracheal deviation present. No thyromegaly present.  Cardiovascular: Normal rate, regular rhythm, normal heart sounds and intact distal pulses.  Exam reveals no gallop and no friction rub.   No murmur heard. Pulmonary/Chest: Effort normal and breath sounds normal. No respiratory distress. She has no wheezes. She has no rales. She exhibits no tenderness.  Abdominal: Soft. Bowel sounds are normal. She exhibits no distension and no mass. There is no tenderness. There is no rebound and no guarding.  Musculoskeletal: Normal range of motion. She exhibits no edema or tenderness.  Lymphadenopathy:    She has no cervical adenopathy.  Neurological: She is alert and oriented to person, place, and time. No cranial nerve deficit. She exhibits normal muscle tone. Coordination normal.  Skin: Skin is warm and dry. No rash noted. She is not diaphoretic. No erythema. No pallor.  Psychiatric: She has a normal mood and affect. Her speech is normal and behavior is normal. Judgment and thought content normal. Cognition and memory are normal.          Assessment & Plan:  Over 84min of which >50% spent in face-to-face contact with patient discussing plan of care  Problem List  Items Addressed This Visit      Unprioritized   Myalgia and myositis - Primary    Recent diffuse myalgia. Exam is normal. Will send basic labs as ordered. Discussed that anxiety may be playing a role in symptoms. Will start Cymbalta $RemoveBefore'20mg'[id number]$  daily. Follow up in 4 weeks and prn.        Relevant Medications   DULoxetine (CYMBALTA) 20 MG capsule   Other Relevant Orders   CBC with Differential/Platelet   Comprehensive metabolic panel   Vit D  25 hydroxy (rtn osteoporosis monitoring)   TSH   T4, free   ANA   B12   C-reactive protein   Sed Rate (ESR)   Rheumatoid Factor   CK (Creatine Kinase)       Return in about 4 weeks (around 03/30/2015) for Recheck.

## 2015-03-03 LAB — RHEUMATOID FACTOR: Rhuematoid fact SerPl-aCnc: <10 IU/mL (ref <=14)

## 2015-03-03 LAB — ANA: Anti Nuclear Antibody(ANA): NEGATIVE

## 2015-03-06 ENCOUNTER — Encounter: Payer: Self-pay | Admitting: *Deleted

## 2015-08-09 ENCOUNTER — Ambulatory Visit (INDEPENDENT_AMBULATORY_CARE_PROVIDER_SITE_OTHER): Payer: BLUE CROSS/BLUE SHIELD | Admitting: Internal Medicine

## 2015-08-09 ENCOUNTER — Other Ambulatory Visit (HOSPITAL_COMMUNITY)
Admission: RE | Admit: 2015-08-09 | Discharge: 2015-08-09 | Disposition: A | Discharge status: Home / Self Care | Payer: BLUE CROSS/BLUE SHIELD | Source: Ambulatory Visit | Attending: Internal Medicine | Admitting: Internal Medicine

## 2015-08-09 ENCOUNTER — Encounter: Payer: Self-pay | Admitting: Internal Medicine

## 2015-08-09 VITALS — BP 94/65 | HR 77 | Temp 98.3°F | Ht 63.5 in | Wt 139.0 lb

## 2015-08-09 DIAGNOSIS — Z23 Encounter for immunization: Secondary | ICD-10-CM

## 2015-08-09 DIAGNOSIS — IMO0001 Reserved for inherently not codable concepts without codable children: Secondary | ICD-10-CM

## 2015-08-09 DIAGNOSIS — G4700 Insomnia, unspecified: Secondary | ICD-10-CM | POA: Diagnosis not present

## 2015-08-09 DIAGNOSIS — Z0000 Encounter for general adult medical examination without abnormal findings: Secondary | ICD-10-CM | POA: Diagnosis not present

## 2015-08-09 DIAGNOSIS — M609 Myositis, unspecified: Secondary | ICD-10-CM | POA: Diagnosis not present

## 2015-08-09 DIAGNOSIS — Z1151 Encounter for screening for human papillomavirus (HPV): Secondary | ICD-10-CM | POA: Insufficient documentation

## 2015-08-09 DIAGNOSIS — Z01419 Encounter for gynecological examination (general) (routine) without abnormal findings: Secondary | ICD-10-CM | POA: Insufficient documentation

## 2015-08-09 DIAGNOSIS — M791 Myalgia: Secondary | ICD-10-CM | POA: Diagnosis not present

## 2015-08-09 MED ORDER — DULOXETINE HCL 20 MG PO CPEP: 20.0000 mg | ORAL_CAPSULE | Freq: Every day | ORAL | Status: DC

## 2015-08-09 MED ORDER — CLONAZEPAM 0.5 MG PO TABS: 0.5000 mg | ORAL_TABLET | Freq: Every day | ORAL | Status: DC

## 2015-08-09 NOTE — Progress Notes (Signed)
Subjective:    Patient ID: Carolyn Thomas, female    DOB: 03/16/1966, 49 y.o.   MRN: [id number]  HPI  49YO female presents for annual exam.  Having some diffuse myalgia. Previous workup was normal. Having trouble sleeping because of pain.  Describes pain as aching pain over arms, legs, neck, back. Sometimes improved with pressure applied. No weakness, numbness noted. Also waking up frequently to urinate. Feels thirsty at times. Having some hot flashes at night. Unable to sleep through the night because of pain and hot flashes. Notes some increased anxiety and depressed mood. Reports her husband, a physician, thinks she needs an antidepressant. She never started Cymbalta as prescribed. Took a "sleeping pill" last night, but unsure what this was. Exercising at a local gym but not pushing hard. Runs on occasion.   Wt Readings from Last 3 Encounters:  08/09/15 139 lb (63.05 kg)  03/02/15 132 lb (59.875 kg)  09/10/13 134 lb (60.782 kg)   BP Readings from Last 3 Encounters:  08/09/15 94/65  03/02/15 100/68  09/10/13 100/72    Past Medical History  Diagnosis Date  . Migraine    Family History  Problem Relation Age of Onset  . Heart disease Father     age 20   Past Surgical History  Procedure Laterality Date  . Cesarean section      x2  . Tubal ligation     Social History   Social History  . Marital Status: Married    Spouse Name: N/A  . Number of Children: N/A  . Years of Education: N/A   Social History Main Topics  . Smoking status: Never Smoker   . Smokeless tobacco: Never Used  . Alcohol Use: No  . Drug Use: No  . Sexual Activity: Not Asked   Other Topics Concern  . None   Social History Narrative   Lives in [place]. Works out regularly. Eats healthy diet. 3 children, 2 living at home. Has cat. Assists in office.    Review of Systems  Constitutional: Positive for diaphoresis and fatigue. Negative for fever, chills, appetite change and unexpected weight  change.  Eyes: Negative for visual disturbance.  Respiratory: Negative for shortness of breath.   Cardiovascular: Negative for chest pain and leg swelling.  Gastrointestinal: Negative for nausea, vomiting, abdominal pain, diarrhea and constipation.  Genitourinary: Positive for frequency. Negative for dysuria, urgency, decreased urine volume and menstrual problem.  Musculoskeletal: Positive for myalgias, back pain, arthralgias and neck pain. Negative for joint swelling and gait problem.  Skin: Negative for color change and rash.  Neurological: Negative for dizziness, tremors, facial asymmetry, weakness, light-headedness, numbness and headaches.  Hematological: Negative for adenopathy. Does not bruise/bleed easily.  Psychiatric/Behavioral: Positive for sleep disturbance and dysphoric mood. The patient is nervous/anxious.        Objective:    BP 94/65 mmHg  Pulse 77  Temp(Src) 98.3 F (36.8 C) (Oral)  Ht 5' 3.5" (1.613 m)  Wt 139 lb (63.05 kg)  BMI 24.23 kg/m2  SpO2 99% Physical Exam  Constitutional: She is oriented to person, place, and time. She appears well-developed and well-nourished. No distress.  HENT:  Head: Normocephalic and atraumatic.  Right Ear: External ear normal.  Left Ear: External ear normal.  Nose: Nose normal.  Mouth/Throat: Oropharynx is clear and moist. No oropharyngeal exudate.  Eyes: Conjunctivae are normal. Pupils are equal, round, and reactive to light. Right eye exhibits no discharge. Left eye exhibits no discharge. No scleral icterus.  Neck: Normal  range of motion. Neck supple. No tracheal deviation present. No thyromegaly present.  Cardiovascular: Normal rate, regular rhythm, normal heart sounds and intact distal pulses.  Exam reveals no gallop and no friction rub.   No murmur heard. Pulmonary/Chest: Effort normal and breath sounds normal. No respiratory distress. She has no wheezes. She has no rales. She exhibits no tenderness.  Abdominal: Soft. Bowel  sounds are normal. She exhibits no distension and no mass. There is no tenderness. There is no rebound and no guarding.  Genitourinary: Rectum normal, vagina normal and uterus normal. No breast swelling, tenderness, discharge or bleeding. Pelvic exam was performed with patient supine. There is no rash, tenderness or lesion on the right labia. There is no rash, tenderness or lesion on the left labia. Uterus is not enlarged and not tender. Cervix exhibits no motion tenderness, no discharge and no friability. Right adnexum displays no mass, no tenderness and no fullness. Left adnexum displays no mass, no tenderness and no fullness. No erythema or tenderness in the vagina. No vaginal discharge found.  Musculoskeletal: Normal range of motion. She exhibits no edema or tenderness.  Lymphadenopathy:    She has no cervical adenopathy.  Neurological: She is alert and oriented to person, place, and time. No cranial nerve deficit. She exhibits normal muscle tone. Coordination normal.  Skin: Skin is warm and dry. No rash noted. She is not diaphoretic. No erythema. No pallor.  Psychiatric: She has a normal mood and affect. Her speech is normal and behavior is normal. Judgment and thought content normal. Cognition and memory are normal.          Assessment & Plan:   Problem List Items Addressed This Visit      Unprioritized   Insomnia    Difficulty sleeping because of hot flashes, anxiety, and myalgia, likely related to menopause. Discussed some potential natural treatments including Melatonin and Magnesium and Benadyrl. Will also start prn Clonazepam at night. Discussed potential risks of this medication. Follow up in 4 weeks and prn.      Myalgia and myositis    Persistent diffuse muscle and joint pain. Exam normal. Lab evaluation normal. Question if menopause and disrupted sleeping playing a role. Will start  Cymbalta daily and Clonazepam at bedtime. Follow up in 4 weeks and prn.      Relevant  Medications   DULoxetine (CYMBALTA) 20 MG capsule   Routine general medical examination at a health care facility - Primary    General medical exam including breast and pelvic exam normal today. PAP pending. Mammogram ordered. Encouraged healthy diet and exercise. Labs from 02/2015 reviewed. Flu vaccine today.      Relevant Orders   MM Digital Screening       Return in about 4 weeks (around 09/06/2015) for Recheck.

## 2015-08-09 NOTE — Assessment & Plan Note (Signed)
Persistent diffuse muscle and joint pain. Exam normal. Lab evaluation normal. Question if menopause and disrupted sleeping playing a role. Will start  Cymbalta daily and Clonazepam at bedtime. Follow up in 4 weeks and prn.

## 2015-08-09 NOTE — Progress Notes (Signed)
Pre visit review using our clinic review tool, if applicable. No additional management support is needed unless otherwise documented below in the visit note. 

## 2015-08-09 NOTE — Assessment & Plan Note (Signed)
General medical exam including breast and pelvic exam normal today. PAP pending. Mammogram ordered. Encouraged healthy diet and exercise. Labs from 02/2015 reviewed. Flu vaccine today.

## 2015-08-09 NOTE — Patient Instructions (Addendum)
Start Cymbalta 20mg  daily. Start Clonazepam 0.5mg  daily at bedtime as needed for sleep.  Health Maintenance Adopting a healthy lifestyle and getting preventive care can go a long way to promote health and wellness. Talk with your health care provider about what schedule of regular examinations is right for you. This is a good chance for you to check in with your provider about disease prevention and staying healthy. In between checkups, there are plenty of things you can do on your own. Experts have done a lot of research about which lifestyle changes and preventive measures are most likely to keep you healthy. Ask your health care provider for more information. WEIGHT AND DIET  Eat a healthy diet  Be sure to include plenty of vegetables, fruits, low-fat dairy products, and lean protein.  Do not eat a lot of foods high in solid fats, added sugars, or salt.  Get regular exercise. This is one of the most important things you can do for your health.  Most adults should exercise for at least 150 minutes each week. The exercise should increase your heart rate and make you sweat (moderate-intensity exercise).  Most adults should also do strengthening exercises at least twice a week. This is in addition to the moderate-intensity exercise.  Maintain a healthy weight  Body mass index (BMI) is a measurement that can be used to identify possible weight problems. It estimates body fat based on height and weight. Your health care provider can help determine your BMI and help you achieve or maintain a healthy weight.  For females 29 years of age and older:   A BMI below 18.5 is considered underweight.  A BMI of 18.5 to 24.9 is normal.  A BMI of 25 to 29.9 is considered overweight.  A BMI of 30 and above is considered obese.  Watch levels of cholesterol and blood lipids  You should start having your blood tested for lipids and cholesterol at 49 years of age, then have this test every 5  years.  You may need to have your cholesterol levels checked more often if:  Your lipid or cholesterol levels are high.  You are older than 49 years of age.  You are at high risk for heart disease.  CANCER SCREENING   Lung Cancer  Lung cancer screening is recommended for adults 31-58 years old who are at high risk for lung cancer because of a history of smoking.  A yearly low-dose CT scan of the lungs is recommended for people who:  Currently smoke.  Have quit within the past 15 years.  Have at least a 30-pack-year history of smoking. A pack year is smoking an average of one pack of cigarettes a day for 1 year.  Yearly screening should continue until it has been 15 years since you quit.  Yearly screening should stop if you develop a health problem that would prevent you from having lung cancer treatment.  Breast Cancer  Practice breast self-awareness. This means understanding how your breasts normally appear and feel.  It also means doing regular breast self-exams. Let your health care provider know about any changes, no matter how small.  If you are in your 20s or 30s, you should have a clinical breast exam (CBE) by a health care provider every 1-3 years as part of a regular health exam.  If you are 53 or older, have a CBE every year. Also consider having a breast X-ray (mammogram) every year.  If you have a family history of  breast cancer, talk to your health care provider about genetic screening.  If you are at high risk for breast cancer, talk to your health care provider about having an MRI and a mammogram every year.  Breast cancer gene (BRCA) assessment is recommended for women who have family members with BRCA-related cancers. BRCA-related cancers include:  Breast.  Ovarian.  Tubal.  Peritoneal cancers.  Results of the assessment will determine the need for genetic counseling and BRCA1 and BRCA2 testing. Cervical Cancer Routine pelvic examinations to  screen for cervical cancer are no longer recommended for nonpregnant women who are considered low risk for cancer of the pelvic organs (ovaries, uterus, and vagina) and who do not have symptoms. A pelvic examination may be necessary if you have symptoms including those associated with pelvic infections. Ask your health care provider if a screening pelvic exam is right for you.   The Pap test is the screening test for cervical cancer for women who are considered at risk.  If you had a hysterectomy for a problem that was not cancer or a condition that could lead to cancer, then you no longer need Pap tests.  If you are older than 65 years, and you have had normal Pap tests for the past 10 years, you no longer need to have Pap tests.  If you have had past treatment for cervical cancer or a condition that could lead to cancer, you need Pap tests and screening for cancer for at least 20 years after your treatment.  If you no longer get a Pap test, assess your risk factors if they change (such as having a new sexual partner). This can affect whether you should start being screened again.  Some women have medical problems that increase their chance of getting cervical cancer. If this is the case for you, your health care provider may recommend more frequent screening and Pap tests.  The human papillomavirus (HPV) test is another test that may be used for cervical cancer screening. The HPV test looks for the virus that can cause cell changes in the cervix. The cells collected during the Pap test can be tested for HPV.  The HPV test can be used to screen women 10 years of age and older. Getting tested for HPV can extend the interval between normal Pap tests from three to five years.  An HPV test also should be used to screen women of any age who have unclear Pap test results.  After 49 years of age, women should have HPV testing as often as Pap tests.  Colorectal Cancer  This type of cancer can be  detected and often prevented.  Routine colorectal cancer screening usually begins at 49 years of age and continues through 49 years of age.  Your health care provider may recommend screening at an earlier age if you have risk factors for colon cancer.  Your health care provider may also recommend using home test kits to check for hidden blood in the stool.  A small camera at the end of a tube can be used to examine your colon directly (sigmoidoscopy or colonoscopy). This is done to check for the earliest forms of colorectal cancer.  Routine screening usually begins at age 28.  Direct examination of the colon should be repeated every 5-10 years through 49 years of age. However, you may need to be screened more often if early forms of precancerous polyps or small growths are found. Skin Cancer  Check your skin from head to  toe regularly.  Tell your health care provider about any new moles or changes in moles, especially if there is a change in a mole's shape or color.  Also tell your health care provider if you have a mole that is larger than the size of a pencil eraser.  Always use sunscreen. Apply sunscreen liberally and repeatedly throughout the day.  Protect yourself by wearing long sleeves, pants, a wide-brimmed hat, and sunglasses whenever you are outside. HEART DISEASE, DIABETES, AND HIGH BLOOD PRESSURE   Have your blood pressure checked at least every 1-2 years. High blood pressure causes heart disease and increases the risk of stroke.  If you are between 13 years and 22 years old, ask your health care provider if you should take aspirin to prevent strokes.  Have regular diabetes screenings. This involves taking a blood sample to check your fasting blood sugar level.  If you are at a normal weight and have a low risk for diabetes, have this test once every three years after 49 years of age.  If you are overweight and have a high risk for diabetes, consider being tested at a  younger age or more often. PREVENTING INFECTION  Hepatitis B  If you have a higher risk for hepatitis B, you should be screened for this virus. You are considered at high risk for hepatitis B if:  You were born in a country where hepatitis B is common. Ask your health care provider which countries are considered high risk.  Your parents were born in a high-risk country, and you have not been immunized against hepatitis B (hepatitis B vaccine).  You have HIV or AIDS.  You use needles to inject street drugs.  You live with someone who has hepatitis B.  You have had sex with someone who has hepatitis B.  You get hemodialysis treatment.  You take certain medicines for conditions, including cancer, organ transplantation, and autoimmune conditions. Hepatitis C  Blood testing is recommended for:  Everyone born from [birth year] through 1965.  Anyone with known risk factors for hepatitis C. Sexually transmitted infections (STIs)  You should be screened for sexually transmitted infections (STIs) including gonorrhea and chlamydia if:  You are sexually active and are younger than 49 years of age.  You are older than 49 years of age and your health care provider tells you that you are at risk for this type of infection.  Your sexual activity has changed since you were last screened and you are at an increased risk for chlamydia or gonorrhea. Ask your health care provider if you are at risk.  If you do not have HIV, but are at risk, it may be recommended that you take a prescription medicine daily to prevent HIV infection. This is called pre-exposure prophylaxis (PrEP). You are considered at risk if:  You are sexually active and do not regularly use condoms or know the HIV status of your partner(s).  You take drugs by injection.  You are sexually active with a partner who has HIV. Talk with your health care provider about whether you are at high risk of being infected with HIV. If you choose  to begin PrEP, you should first be tested for HIV. You should then be tested every 3 months for as long as you are taking PrEP.  PREGNANCY   If you are premenopausal and you may become pregnant, ask your health care provider about preconception counseling.  If you may become pregnant, take 400 to 800 micrograms (mcg)  of folic acid every day.  If you want to prevent pregnancy, talk to your health care provider about birth control (contraception). OSTEOPOROSIS AND MENOPAUSE   Osteoporosis is a disease in which the bones lose minerals and strength with aging. This can result in serious bone fractures. Your risk for osteoporosis can be identified using a bone density scan.  If you are 61 years of age or older, or if you are at risk for osteoporosis and fractures, ask your health care provider if you should be screened.  Ask your health care provider whether you should take a calcium or vitamin D supplement to lower your risk for osteoporosis.  Menopause may have certain physical symptoms and risks.  Hormone replacement therapy may reduce some of these symptoms and risks. Talk to your health care provider about whether hormone replacement therapy is right for you.  HOME CARE INSTRUCTIONS   Schedule regular health, dental, and eye exams.  Stay current with your immunizations.   Do not use any tobacco products including cigarettes, chewing tobacco, or electronic cigarettes.  If you are pregnant, do not drink alcohol.  If you are breastfeeding, limit how much and how often you drink alcohol.  Limit alcohol intake to no more than 1 drink per day for nonpregnant women. One drink equals 12 ounces of beer, 5 ounces of wine, or 1 ounces of hard liquor.  Do not use street drugs.  Do not share needles.  Ask your health care provider for help if you need support or information about quitting drugs.  Tell your health care provider if you often feel depressed.  Tell your health care  provider if you have ever been abused or do not feel safe at home. Document Released: 05/13/2011 Document Revised: 03/14/2014 Document Reviewed: 09/29/2013 [facility] Patient Information 2015 [place], [place]. This information is not intended to replace advice given to you by your health care provider. Make sure you discuss any questions you have with your health care provider.

## 2015-08-09 NOTE — Addendum Note (Signed)
Addended by: Montine Circle D on: 08/09/2015 02:48 PM   Modules accepted: Orders

## 2015-08-09 NOTE — Assessment & Plan Note (Signed)
Difficulty sleeping because of hot flashes, anxiety, and myalgia, likely related to menopause. Discussed some potential natural treatments including Melatonin and Magnesium and Benadyrl. Will also start prn Clonazepam at night. Discussed potential risks of this medication. Follow up in 4 weeks and prn.

## 2015-08-10 LAB — CYTOLOGY - PAP

## 2015-09-06 ENCOUNTER — Ambulatory Visit: Payer: BLUE CROSS/BLUE SHIELD | Admitting: Internal Medicine

## 2015-09-07 ENCOUNTER — Telehealth: Payer: Self-pay | Admitting: Internal Medicine

## 2015-09-07 ENCOUNTER — Ambulatory Visit: Payer: BLUE CROSS/BLUE SHIELD | Admitting: Internal Medicine

## 2015-09-07 NOTE — Telephone Encounter (Signed)
[facility]. They have recommended additional views. Has this been scheduled?

## 2015-09-13 NOTE — Telephone Encounter (Signed)
Pt states that she has an appt for additional views

## 2015-09-21 ENCOUNTER — Encounter: Payer: Self-pay | Admitting: Internal Medicine

## 2015-09-21 ENCOUNTER — Ambulatory Visit (INDEPENDENT_AMBULATORY_CARE_PROVIDER_SITE_OTHER): Payer: BLUE CROSS/BLUE SHIELD | Admitting: Internal Medicine

## 2015-09-21 VITALS — BP 99/66 | HR 81 | Temp 98.3°F | Ht 63.5 in | Wt 137.1 lb

## 2015-09-21 DIAGNOSIS — G4700 Insomnia, unspecified: Secondary | ICD-10-CM | POA: Diagnosis not present

## 2015-09-21 DIAGNOSIS — M791 Myalgia: Secondary | ICD-10-CM | POA: Diagnosis not present

## 2015-09-21 DIAGNOSIS — IMO0001 Reserved for inherently not codable concepts without codable children: Secondary | ICD-10-CM

## 2015-09-21 DIAGNOSIS — M609 Myositis, unspecified: Secondary | ICD-10-CM

## 2015-09-21 MED ORDER — DULOXETINE HCL 20 MG PO CPEP: 20.0000 mg | ORAL_CAPSULE | Freq: Every day | ORAL | Status: DC

## 2015-09-21 NOTE — Assessment & Plan Note (Signed)
Symptoms improved with only rare use of Clonazepam. Will continue prn.

## 2015-09-21 NOTE — Progress Notes (Signed)
Pre visit review using our clinic review tool, if applicable. No additional management support is needed unless otherwise documented below in the visit note. 

## 2015-09-21 NOTE — Progress Notes (Signed)
Subjective:    Patient ID: Carolyn Thomas, female    DOB: 1965-11-24, 49 y.o.   MRN: [id number]  HPI  49YO female presents for follow up.  Recently seen for myalgia and insomnia. Started on Cymbalta and prn Clonazepam.  Pain improved with Cymbalta. Muscle cramping completely resolved. She is back running and doing yoga on a regular basis. Insomnia improved with Clonazepam. Used on 2 occasions. Not taking anything nightly.  Wt Readings from Last 3 Encounters:  09/21/15 137 lb 2 oz (62.199 kg)  08/09/15 139 lb (63.05 kg)  03/02/15 132 lb (59.875 kg)   BP Readings from Last 3 Encounters:  09/21/15 99/66  08/09/15 94/65  03/02/15 100/68    Past Medical History  Diagnosis Date  . Migraine    Family History  Problem Relation Age of Onset  . Heart disease Father     age 49   Past Surgical History  Procedure Laterality Date  . Cesarean section      x2  . Tubal ligation     Social History   Social History  . Marital Status: Married    Spouse Name: N/A  . Number of Children: N/A  . Years of Education: N/A   Social History Main Topics  . Smoking status: Never Smoker   . Smokeless tobacco: Never Used  . Alcohol Use: No  . Drug Use: No  . Sexual Activity: Not on file   Other Topics Concern  . Not on file   Social History Narrative   [place]. Works out regularly. Eats healthy diet. 3 children, 2 living at home. Has cat. Assists in office.    Review of Systems  Constitutional: Negative for fever, chills, appetite change, fatigue and unexpected weight change.  Eyes: Negative for visual disturbance.  Respiratory: Negative for shortness of breath.   Cardiovascular: Negative for chest pain and leg swelling.  Gastrointestinal: Negative for nausea, vomiting, abdominal pain, diarrhea and constipation.  Musculoskeletal: Negative for myalgias and arthralgias.  Skin: Negative for color change and rash.  Hematological: Negative for adenopathy. Does not  bruise/bleed easily.  Psychiatric/Behavioral: Negative for suicidal ideas, sleep disturbance and dysphoric mood. The patient is not nervous/anxious.        Objective:    BP 99/66 mmHg  Pulse 81  Temp(Src) 98.3 F (36.8 C) (Oral)  Ht 5' 3.5" (1.613 m)  Wt 137 lb 2 oz (62.199 kg)  BMI 23.91 kg/m2  SpO2 97% Physical Exam  Constitutional: She is oriented to person, place, and time. She appears well-developed and well-nourished. No distress.  HENT:  Head: Normocephalic and atraumatic.  Right Ear: External ear normal.  Left Ear: External ear normal.  Nose: Nose normal.  Mouth/Throat: Oropharynx is clear and moist. No oropharyngeal exudate.  Eyes: Conjunctivae are normal. Pupils are equal, round, and reactive to light. Right eye exhibits no discharge. Left eye exhibits no discharge. No scleral icterus.  Neck: Normal range of motion. Neck supple. No tracheal deviation present. No thyromegaly present.  Cardiovascular: Normal rate, regular rhythm, normal heart sounds and intact distal pulses.  Exam reveals no gallop and no friction rub.   No murmur heard. Pulmonary/Chest: Effort normal and breath sounds normal. No respiratory distress. She has no wheezes. She has no rales. She exhibits no tenderness.  Musculoskeletal: Normal range of motion. She exhibits no edema or tenderness.  Lymphadenopathy:    She has no cervical adenopathy.  Neurological: She is alert and oriented to person, place, and time. No cranial nerve  deficit. She exhibits normal muscle tone. Coordination normal.  Skin: Skin is warm and dry. No rash noted. She is not diaphoretic. No erythema. No pallor.  Psychiatric: She has a normal mood and affect. Her behavior is normal. Judgment and thought content normal.          Assessment & Plan:   Problem List Items Addressed This Visit    None       No Follow-up on file.

## 2015-09-21 NOTE — Patient Instructions (Signed)
Consider contacting [facility] for marriage counseling.

## 2015-09-21 NOTE — Assessment & Plan Note (Signed)
Symptoms improved with use of Cymbalta. Will continue. Follow up prn.

## 2015-09-26 LAB — HM MAMMOGRAPHY: HM Mammogram: NEGATIVE

## 2015-12-11 ENCOUNTER — Other Ambulatory Visit: Payer: Self-pay | Admitting: Internal Medicine

## 2015-12-11 NOTE — Telephone Encounter (Signed)
Last OV: 09/21/15/ No future appts. Okay to refill?

## 2015-12-12 ENCOUNTER — Encounter: Payer: Self-pay | Admitting: *Deleted

## 2015-12-13 NOTE — Telephone Encounter (Signed)
Rx faxed by Julie 

## 2016-01-16 ENCOUNTER — Other Ambulatory Visit: Payer: Self-pay | Admitting: Internal Medicine

## 2016-01-17 NOTE — Telephone Encounter (Signed)
Giving verbal order.

## 2016-01-17 NOTE — Telephone Encounter (Signed)
Changed to 60-days with 5 refills.

## 2016-01-17 NOTE — Telephone Encounter (Signed)
Called in a verbal for 60-days with 5 refills. Won't allow to refuse and state changed by other means.

## 2016-02-15 ENCOUNTER — Encounter: Payer: Self-pay | Admitting: Internal Medicine

## 2016-02-15 ENCOUNTER — Telehealth: Payer: Self-pay

## 2016-02-15 ENCOUNTER — Ambulatory Visit (INDEPENDENT_AMBULATORY_CARE_PROVIDER_SITE_OTHER): Payer: BLUE CROSS/BLUE SHIELD | Admitting: Internal Medicine

## 2016-02-15 VITALS — BP 95/60 | HR 76 | Temp 98.0°F | Ht 63.5 in | Wt 135.0 lb

## 2016-02-15 DIAGNOSIS — M791 Myalgia: Secondary | ICD-10-CM

## 2016-02-15 DIAGNOSIS — N952 Postmenopausal atrophic vaginitis: Secondary | ICD-10-CM | POA: Diagnosis not present

## 2016-02-15 DIAGNOSIS — G4700 Insomnia, unspecified: Secondary | ICD-10-CM | POA: Diagnosis not present

## 2016-02-15 DIAGNOSIS — M609 Myositis, unspecified: Secondary | ICD-10-CM

## 2016-02-15 DIAGNOSIS — IMO0001 Reserved for inherently not codable concepts without codable children: Secondary | ICD-10-CM

## 2016-02-15 MED ORDER — TRETINOIN 0.1 % EX CREA: TOPICAL_CREAM | Freq: Every day | CUTANEOUS | Status: DC

## 2016-02-15 MED ORDER — ESTROGENS, CONJUGATED 0.625 MG/GM VA CREA: 1.0000 | TOPICAL_CREAM | Freq: Every day | VAGINAL | Status: DC

## 2016-02-15 MED ORDER — CLONAZEPAM 0.5 MG PO TABS: 0.5000 mg | ORAL_TABLET | Freq: Every day | ORAL | Status: DC

## 2016-02-15 NOTE — Assessment & Plan Note (Signed)
Discussed atrophic vaginitis and reviewed previous PAP which confirmed atrophy. Discussed options for management. Will start topical Premarin cream applied daily for 1-2 weeks then decrease to 1-2 times per week. Follow up if symptoms not improving.

## 2016-02-15 NOTE — Progress Notes (Signed)
Subjective:    Patient ID: Carolyn Thomas, female    DOB: 12-14-1965, 50 y.o.   MRN: [id number]  HPI  50YO female presents for acute visit.  Recently notes some pain with intercourse and vaginal dryness. No vaginal discharge noted. No bleeding noted.   Notes improvement/resolution of muscle cramping with use of Cymbalta. Also notes improvement in insomnia with use of occasional Clonazepam. No side effects noted.  Wt Readings from Last 3 Encounters:  02/15/16 135 lb (61.236 kg)  09/21/15 137 lb 2 oz (62.199 kg)  08/09/15 139 lb (63.05 kg)   BP Readings from Last 3 Encounters:  02/15/16 95/60  09/21/15 99/66  08/09/15 94/65    Past Medical History  Diagnosis Date  . Migraine    Family History  Problem Relation Age of Onset  . Heart disease Father     age 52   Past Surgical History  Procedure Laterality Date  . Cesarean section      x2  . Tubal ligation     Social History   Social History  . Marital Status: Married    Spouse Name: N/A  . Number of Children: N/A  . Years of Education: N/A   Social History Main Topics  . Smoking status: Never Smoker   . Smokeless tobacco: Never Used  . Alcohol Use: No  . Drug Use: No  . Sexual Activity: Not Asked   Other Topics Concern  . None   Social History Narrative   Lives in [place]. Works out regularly. Eats healthy diet. 3 children, 2 living at home. Has cat. Assists in office.    Review of Systems  Constitutional: Negative for fever, chills, appetite change, fatigue and unexpected weight change.  Eyes: Negative for visual disturbance.  Respiratory: Negative for shortness of breath.   Cardiovascular: Negative for chest pain and leg swelling.  Gastrointestinal: Negative for nausea, vomiting, abdominal pain, diarrhea and constipation.  Genitourinary: Positive for vaginal pain. Negative for flank pain, vaginal bleeding, vaginal discharge, menstrual problem and pelvic pain.  Musculoskeletal: Negative for myalgias  and arthralgias.  Skin: Negative for color change and rash.  Hematological: Negative for adenopathy. Does not bruise/bleed easily.  Psychiatric/Behavioral: Negative for sleep disturbance and dysphoric mood. The patient is not nervous/anxious.        Objective:    BP 95/60 mmHg  Pulse 76  Temp(Src) 98 F (36.7 C) (Oral)  Ht 5' 3.5" (1.613 m)  Wt 135 lb (61.236 kg)  BMI 23.54 kg/m2  SpO2 97% Physical Exam  Constitutional: She is oriented to person, place, and time. She appears well-developed and well-nourished. No distress.  HENT:  Head: Normocephalic and atraumatic.  Right Ear: External ear normal.  Left Ear: External ear normal.  Nose: Nose normal.  Mouth/Throat: Oropharynx is clear and moist.  Eyes: Conjunctivae are normal. Pupils are equal, round, and reactive to light. Right eye exhibits no discharge. Left eye exhibits no discharge. No scleral icterus.  Neck: Normal range of motion. Neck supple. No tracheal deviation present. No thyromegaly present.  Cardiovascular: Normal rate, regular rhythm, normal heart sounds and intact distal pulses.  Exam reveals no gallop and no friction rub.   No murmur heard. Pulmonary/Chest: Effort normal and breath sounds normal. No respiratory distress. She has no wheezes. She has no rales. She exhibits no tenderness.  Musculoskeletal: Normal range of motion. She exhibits no edema or tenderness.  Lymphadenopathy:    She has no cervical adenopathy.  Neurological: She is alert and oriented to person,  place, and time. No cranial nerve deficit. She exhibits normal muscle tone. Coordination normal.  Skin: Skin is warm and dry. No rash noted. She is not diaphoretic. No erythema. No pallor.  Psychiatric: She has a normal mood and affect. Her behavior is normal. Judgment and thought content normal.          Assessment & Plan:   Problem List Items Addressed This Visit      Unprioritized   Atrophic vaginitis - Primary    Discussed atrophic  vaginitis and reviewed previous PAP which confirmed atrophy. Discussed options for management. Will start topical Premarin cream applied daily for 1-2 weeks then decrease to 1-2 times per week. Follow up if symptoms not improving.      Insomnia    Symptoms improved with prn Clonazepam. Will continue.      Myalgia and myositis    Symptoms resolved with use of Cymbalta.          Return if symptoms worsen or fail to improve.  Carolyn PolioJennifer Walker, MD Internal Medicine [facility]

## 2016-02-15 NOTE — Progress Notes (Signed)
Pre visit review using our clinic review tool, if applicable. No additional management support is needed unless otherwise documented below in the visit note. 

## 2016-02-15 NOTE — Assessment & Plan Note (Signed)
Symptoms improved with prn Clonazepam. Will continue.

## 2016-02-15 NOTE — Assessment & Plan Note (Signed)
Symptoms resolved with use of Cymbalta.

## 2016-02-15 NOTE — Telephone Encounter (Signed)
PA for Tretinoin 0.1% cream completed on Cover my Meds.

## 2016-04-04 DIAGNOSIS — H5203 Hypermetropia, bilateral: Secondary | ICD-10-CM | POA: Diagnosis not present

## 2016-07-23 ENCOUNTER — Telehealth: Payer: Self-pay

## 2016-07-23 NOTE — Telephone Encounter (Signed)
Clonazepam( Klonopin) 0.5 tableyt Last Ov: 02/15/2016 Last refilled 02/15/2016 Please advise

## 2016-07-23 NOTE — Telephone Encounter (Signed)
Not my patient., NEVER SEEN SO I CANNOT REFILL. I have NOT ACCEPTED THIs patiENT.

## 2016-07-29 ENCOUNTER — Encounter (INDEPENDENT_AMBULATORY_CARE_PROVIDER_SITE_OTHER): Payer: Self-pay

## 2016-07-29 ENCOUNTER — Other Ambulatory Visit: Payer: Self-pay | Admitting: Family Medicine

## 2016-07-29 ENCOUNTER — Ambulatory Visit (INDEPENDENT_AMBULATORY_CARE_PROVIDER_SITE_OTHER): Payer: BLUE CROSS/BLUE SHIELD | Admitting: Family Medicine

## 2016-07-29 VITALS — BP 104/78 | HR 66 | Temp 98.1°F | Wt 139.5 lb

## 2016-07-29 DIAGNOSIS — M79602 Pain in left arm: Secondary | ICD-10-CM | POA: Diagnosis not present

## 2016-07-29 DIAGNOSIS — M791 Myalgia: Secondary | ICD-10-CM | POA: Diagnosis not present

## 2016-07-29 DIAGNOSIS — M609 Myositis, unspecified: Secondary | ICD-10-CM

## 2016-07-29 DIAGNOSIS — R21 Rash and other nonspecific skin eruption: Secondary | ICD-10-CM | POA: Diagnosis not present

## 2016-07-29 DIAGNOSIS — IMO0001 Reserved for inherently not codable concepts without codable children: Secondary | ICD-10-CM

## 2016-07-29 DIAGNOSIS — Z23 Encounter for immunization: Secondary | ICD-10-CM | POA: Diagnosis not present

## 2016-07-29 MED ORDER — TRIAMCINOLONE ACETONIDE 0.1 % EX OINT: 1.0000 "application " | TOPICAL_OINTMENT | Freq: Two times a day (BID) | CUTANEOUS | 0 refills | Status: DC

## 2016-07-29 MED ORDER — DULOXETINE HCL 20 MG PO CPEP: 20.0000 mg | ORAL_CAPSULE | Freq: Every day | ORAL | 11 refills | Status: DC

## 2016-07-29 MED ORDER — TRETINOIN 0.1 % EX CREA: TOPICAL_CREAM | Freq: Every day | CUTANEOUS | 6 refills | Status: DC

## 2016-07-29 NOTE — Progress Notes (Signed)
Subjective:  Patient ID: Carolyn Thomas, female Maximino Sarin   DOB: [birth year]  Age: 50 y.o. MRN: [id number]  CC: Rash/? Bites, L arm pain  HPI:  50 year old female presents with the above complaints.  Over the weekend, patient was exercising outside. She subsequently developed areas of erythema and itching. She states that they have been worsening and more areas are popping up. They're located on her right arm. No medications or interventions tried. No associated fevers or chills. No other associated symptoms.  Additionally, patient reports ongoing left arm pain. Patient reports that this is been worsening over the past several weeks. Pain is intermittent and occasionally severe. She states it is currently 5/10 in severity. She states the pain is located in the left trapezius muscle and radiates down left arm. She has taken Tylenol with some improvement. She is currently taking Cymbalta for myalgias/pain. No known exacerbating factors. No reports of numbness/tingling. No other associated symptoms. No other complaints at this time.  Social Hx   Social History   Social History  . Marital status: Married    Spouse name: N/A  . Number of children: N/A  . Years of education: N/A   Social History Main Topics  . Smoking status: Never Smoker  . Smokeless tobacco: Never Used  . Alcohol use No  . Drug use: No  . Sexual activity: Not on file   Other Topics Concern  . Not on file   Social History Narrative   [place] in [place]. Works out regularly. Eats healthy diet. 3 children, 2 living at home. Has cat. Assists in office.    Review of Systems  Constitutional: Negative.   Musculoskeletal: Positive for myalgias.       Left arm pain.  Skin: Positive for rash.   Objective:  BP 104/78 (BP Location: Left Arm, Patient Position: Sitting, Cuff Size: Normal)   Pulse 66   Temp 98.1 F (36.7 C) (Oral)   Wt 139 lb 8 oz (63.3 kg)   SpO2 93%   BMI 24.32 kg/m   BP/Weight 07/29/2016 02/15/2016 09/21/2015   Systolic BP 104 95 99  Diastolic BP 78 60 66  Wt. (Lbs) 139.5 135 137.13  BMI 24.32 23.54 23.91   Physical Exam  Constitutional: She is oriented to person, place, and time. She appears well-developed. No distress.  Cardiovascular: Normal rate and regular rhythm.   Pulmonary/Chest: Effort normal and breath sounds normal.  Musculoskeletal:  Left arm - normal range of motion and strength of the shoulder. Trapezius tension noted on the left.  Neurological: She is alert and oriented to person, place, and time.  Skin:  Right arm - several papules with erythema noted. Bruising of the upper extremity noted. No induration. No drainage from the areas.  Psychiatric: She has a normal mood and affect.  Vitals reviewed.   Lab Results  Component Value Date   WBC 7.8 03/02/2015   HGB 14.5 03/02/2015   HCT 42.6 03/02/2015   PLT 269.0 03/02/2015   GLUCOSE 82 03/02/2015   CHOL 200 09/10/2013   TRIG 54 09/10/2013   HDL 67 09/10/2013   LDLDIRECT 138.2 09/08/2012   LDLCALC 122 (H) 09/10/2013   ALT 15 03/02/2015   AST 25 03/02/2015   NA 138 03/02/2015   K 4.6 03/02/2015   CL 103 03/02/2015   CREATININE 0.70 03/02/2015   BUN 16 03/02/2015   CO2 30 03/02/2015   TSH 1.66 03/02/2015    Assessment & Plan:   Problem List Items Addressed This Visit  Myalgia and myositis   Relevant Medications   DULoxetine (CYMBALTA) 20 MG capsule   Left arm pain    New problem. Likely MSK in nature. Patient would like to proceed with nonpharmacologic options. Sending to PT. Advised Tylenol 1000 g 3 times a day PRN.      Relevant Orders   Ambulatory referral to Physical Therapy   Rash - Primary    New problem. Bites vs contact dermatitis/allergen exposure. No evidence of infection. Treating with topical triamcinolone.       Other Visit Diagnoses    Encounter for immunization       Relevant Orders   Flu Vaccine QUAD 36+ mos IM (Completed)      Meds ordered this encounter  Medications  .  DULoxetine (CYMBALTA) 20 MG capsule    Sig: Take 1 capsule (20 mg total) by mouth daily.    Dispense:  30 capsule    Refill:  11  . triamcinolone ointment (KENALOG) 0.1 %    Sig: Apply 1 application topically 2 (two) times daily.    Dispense:  30 g    Refill:  0  . tretinoin (RETIN-A) 0.1 % cream    Sig: Apply topically at bedtime.    Dispense:  45 g    Refill:  6    Follow-up: Return in about 3 months (around 10/28/2016).  Everlene Other DO [facility]

## 2016-07-29 NOTE — Assessment & Plan Note (Signed)
New problem. Likely MSK in nature. Patient would like to proceed with nonpharmacologic options. Sending to PT. Advised Tylenol 1000 g 3 times a day PRN.

## 2016-07-29 NOTE — Telephone Encounter (Signed)
Refilled 02/15/16. Pt seen today 07/29/16. Please advise?

## 2016-07-29 NOTE — Assessment & Plan Note (Signed)
New problem. Bites vs contact dermatitis/allergen exposure. No evidence of infection. Treating with topical triamcinolone.

## 2016-07-29 NOTE — Patient Instructions (Addendum)
Use the topical agent twice daily as needed.  We will call regarding the PT.  Follow up with me in the next few months.  Take care  Dr. Adriana Simasook

## 2016-07-29 NOTE — Progress Notes (Signed)
Pre visit review using our clinic review tool, if applicable. No additional management support is needed unless otherwise documented below in the visit note. 

## 2016-07-30 NOTE — Telephone Encounter (Signed)
faxed

## 2016-08-05 ENCOUNTER — Ambulatory Visit: Payer: BLUE CROSS/BLUE SHIELD | Attending: Family Medicine

## 2016-08-14 ENCOUNTER — Ambulatory Visit: Payer: BLUE CROSS/BLUE SHIELD

## 2016-09-09 ENCOUNTER — Other Ambulatory Visit: Payer: Self-pay | Admitting: Family Medicine

## 2016-09-10 NOTE — Telephone Encounter (Signed)
Refilled 07/30/16. Pt last seen 07/29/16. Please advise?

## 2016-09-10 NOTE — Telephone Encounter (Signed)
Faxed

## 2016-09-24 ENCOUNTER — Encounter: Payer: Self-pay | Admitting: Family

## 2016-09-24 ENCOUNTER — Encounter: Payer: BLUE CROSS/BLUE SHIELD | Admitting: Family

## 2016-09-24 NOTE — Progress Notes (Signed)
Pre visit review using our clinic review tool, if applicable. No additional management support is needed unless otherwise documented below in the visit note. 

## 2016-09-26 NOTE — Progress Notes (Signed)
This encounter was created in error - please disregard.

## 2016-10-08 ENCOUNTER — Ambulatory Visit: Payer: BLUE CROSS/BLUE SHIELD | Admitting: Family Medicine

## 2016-10-11 ENCOUNTER — Telehealth: Payer: Self-pay | Admitting: *Deleted

## 2016-10-11 DIAGNOSIS — M797 Fibromyalgia: Secondary | ICD-10-CM

## 2016-10-11 HISTORY — DX: Fibromyalgia: M79.7

## 2016-10-15 ENCOUNTER — Ambulatory Visit (INDEPENDENT_AMBULATORY_CARE_PROVIDER_SITE_OTHER): Payer: BLUE CROSS/BLUE SHIELD | Admitting: Family Medicine

## 2016-10-15 ENCOUNTER — Encounter: Payer: Self-pay | Admitting: Family Medicine

## 2016-10-15 DIAGNOSIS — M797 Fibromyalgia: Secondary | ICD-10-CM | POA: Diagnosis not present

## 2016-10-15 NOTE — Patient Instructions (Signed)
Exercise regularly.  Continue the cymbalta.  Follow up in 3 months (or send me a message via mychart of how you're doing).  Take care  Dr. Adriana Simasook    Myofascial Pain Syndrome and Fibromyalgia Introduction Myofascial pain syndrome and fibromyalgia are both pain disorders. This pain may be felt mainly in your muscles.  Myofascial pain syndrome:  Always has trigger points or tender points in the muscle that will cause pain when pressed. The pain may come and go.  Usually affects your neck, upper back, and shoulder areas. The pain often radiates into your arms and hands.  Fibromyalgia:  Has muscle pains and tenderness that come and go.  Is often associated with fatigue and sleep disturbances.  Has trigger points.  Tends to be long-lasting (chronic), but is not life-threatening. Fibromyalgia and myofascial pain are not the same. However, they often occur together. If you have both conditions, each can make the other worse. Both are common and can cause enough pain and fatigue to make day-to-day activities difficult. What are the causes? The exact causes of fibromyalgia and myofascial pain are not known. People with certain gene types may be more likely to develop fibromyalgia. Some factors can be triggers for both conditions, such as:  Spine disorders.  Arthritis.  Severe injury (trauma) and other physical stressors.  Being under a lot of stress.  A medical illness. What are the signs or symptoms? Fibromyalgia  The main symptom of fibromyalgia is widespread pain and tenderness in your muscles. This can vary over time. Pain is sometimes described as stabbing, shooting, or burning. You may have tingling or numbness, too. You may also have sleep problems and fatigue. You may wake up feeling tired and groggy (fibro fog). Other symptoms may include:  Bowel and bladder problems.  Headaches.  Visual problems.  Problems with odors and noises.  Depression or mood  changes.  Painful menstrual periods (dysmenorrhea).  Dry skin or eyes. Myofascial pain syndrome  Symptoms of myofascial pain syndrome include:  Tight, ropy bands of muscle.  Uncomfortable sensations in muscular areas, such as:  Aching.  Cramping.  Burning.  Numbness.  Tingling.  Muscle weakness.  Trouble moving certain muscles freely (range of motion). How is this diagnosed? There are no specific tests to diagnose fibromyalgia or myofascial pain syndrome. Both can be hard to diagnose because their symptoms are common in many other conditions. Your health care provider may suspect one or both of these conditions based on your symptoms and medical history. Your health care provider will also do a physical exam. The key to diagnosing fibromyalgia is having pain, fatigue, and other symptoms for more than three months that cannot be explained by another condition. The key to diagnosing myofascial pain syndrome is finding trigger points in muscles that are tender and cause pain elsewhere in your body (referred pain). How is this treated? Treating fibromyalgia and myofascial pain often requires a team of health care providers. This usually starts with your primary provider and a physical therapist. You may also find it helpful to work with alternative health care providers, such as massage therapists or acupuncturists. Treatment for fibromyalgia may include medicines. This may include nonsteroidal anti-inflammatory drugs (NSAIDs), along with other medicines. Treatment for myofascial pain may also include:  NSAIDs.  Cooling and stretching of muscles.  Trigger point injections.  Sound wave (ultrasound) treatments to stimulate muscles. Follow these instructions at home:  Take medicines only as directed by your health care provider.  Exercise as directed by  your health care provider or physical therapist.  Try to avoid stressful situations.  Practice relaxation techniques to  control your stress. You may want to try:  Biofeedback.  Visual imagery.  Hypnosis.  Muscle relaxation.  Yoga.  Meditation.  Talk to your health care provider about alternative treatments, such as acupuncture or massage treatment.  Maintain a healthy lifestyle. This includes eating a healthy diet and getting enough sleep.  Consider joining a support group.  Do not do activities that stress or strain your muscles. That includes repetitive motions and heavy lifting. Where to find more information:  National Fibromyalgia Association: www.fmaware.org  Arthritis Foundation: www.arthritis.org  American Chronic Pain Association: GumSearch.nlwww.theacpa.org/condition/myofascial-pain Contact a health care provider if:  You have new symptoms.  Your symptoms get worse.  You have side effects from your medicines.  You have trouble sleeping.  Your condition is causing depression or anxiety. This information is not intended to replace advice given to you by your health care provider. Make sure you discuss any questions you have with your health care provider. Document Released: 10/28/2005 Document Revised: 04/04/2016 Document Reviewed: 08/03/2014  2017 Elsevier

## 2016-10-16 DIAGNOSIS — M797 Fibromyalgia: Secondary | ICD-10-CM | POA: Insufficient documentation

## 2016-10-16 NOTE — Assessment & Plan Note (Signed)
New diagnosis. Based on patient's history and complaints I suspect that she has fibromyalgia. She is currently on Cymbalta. Will continue. Discussed increase in dosing and patient declined. Advised consistent/regular exercise.

## 2016-10-16 NOTE — Progress Notes (Signed)
   Subjective:  Patient ID: Carolyn Thomas, female    DOB: 1966/10/20  Age: 50 y.o. MRN: [id number]  CC: Pain, cramping  HPI:  [age] year old female with a prior history of myalgias/myositis presents with the above complaint.  Patient has had ongoing issues with diffuse pain over the past 1-2 years. This is been worse as of late. She reports essentially pain all over on daily basis - arms, legs, back. Pain is constant. She has been placed on Cymbalta and has had some improvement. She exercises on a regular basis. Patient also states that she feels like she has "cramps" in her lower extremities bilaterally. Patient states that they constantly ache. She "wraps them up" and uses compression with improvement. No known exacerbating factors. Patient is concerned that she has an underlying pathological condition is causing her pain. She would like to discuss this today.  Social Hx   Social History   Social History  . Marital status: Married    Spouse name: N/A  . Number of children: N/A  . Years of education: N/A   Social History Main Topics  . Smoking status: Never Smoker  . Smokeless tobacco: Never Used  . Alcohol use No  . Drug use: No  . Sexual activity: Not Asked   Other Topics Concern  . None   Social History Narrative   [place]. Works out regularly. Eats healthy diet. 3 children, 2 living at home. Has cat. Assists in office.    Review of Systems  Constitutional: Negative.   Musculoskeletal: Positive for arthralgias and myalgias.   Objective:  BP (!) 93/55 (BP Location: Left Arm, Patient Position: Sitting, Cuff Size: Normal)   Pulse 81   Temp 98.2 F (36.8 C) (Oral)   Resp 12   Wt 138 lb 4 oz (62.7 kg)   SpO2 99%   BMI 23.73 kg/m   BP/Weight 10/15/2016 09/24/2016 07/29/2016  Systolic BP 93 110 104  Diastolic BP 55 72 78  Wt. (Lbs) 138.25 140 139.5  BMI 23.73 24.03 24.32   Physical Exam  Constitutional: She is oriented to person, place, and time. She appears  well-developed. No distress.  Cardiovascular: Normal rate and regular rhythm.   Pulmonary/Chest: Effort normal and breath sounds normal.  Musculoskeletal:  Diffuse pain. No evidence of swelling/synovitis.  Neurological: She is alert and oriented to person, place, and time.  Psychiatric: She has a normal mood and affect.  Vitals reviewed.  Lab Results  Component Value Date   WBC 7.8 03/02/2015   HGB 14.5 03/02/2015   HCT 42.6 03/02/2015   PLT 269.0 03/02/2015   GLUCOSE 82 03/02/2015   CHOL 200 09/10/2013   TRIG 54 09/10/2013   HDL 67 09/10/2013   LDLDIRECT 138.2 09/08/2012   LDLCALC 122 (H) 09/10/2013   ALT 15 03/02/2015   AST 25 03/02/2015   NA 138 03/02/2015   K 4.6 03/02/2015   CL 103 03/02/2015   CREATININE 0.70 03/02/2015   BUN 16 03/02/2015   CO2 30 03/02/2015   TSH 1.66 03/02/2015    Assessment & Plan:   Problem List Items Addressed This Visit    Fibromyalgia    New diagnosis. Based on patient's history and complaints I suspect that she has fibromyalgia. She is currently on Cymbalta. Will continue. Discussed increase in dosing and patient declined. Advised consistent/regular exercise.         Follow-up: PRN  Everlene OtherJayce Tywana Robotham DO [facility] [place]

## 2016-10-28 ENCOUNTER — Ambulatory Visit: Payer: BLUE CROSS/BLUE SHIELD | Admitting: Family Medicine

## 2016-11-13 ENCOUNTER — Encounter: Payer: Self-pay | Admitting: Internal Medicine

## 2016-11-13 ENCOUNTER — Ambulatory Visit (INDEPENDENT_AMBULATORY_CARE_PROVIDER_SITE_OTHER): Payer: BLUE CROSS/BLUE SHIELD | Admitting: Internal Medicine

## 2016-11-13 VITALS — BP 100/70 | HR 81 | Temp 98.1°F | Resp 16 | Ht 63.0 in | Wt 139.5 lb

## 2016-11-13 DIAGNOSIS — M797 Fibromyalgia: Secondary | ICD-10-CM

## 2016-11-13 DIAGNOSIS — E559 Vitamin D deficiency, unspecified: Secondary | ICD-10-CM

## 2016-11-13 DIAGNOSIS — R5383 Other fatigue: Secondary | ICD-10-CM

## 2016-11-13 DIAGNOSIS — Z1231 Encounter for screening mammogram for malignant neoplasm of breast: Secondary | ICD-10-CM | POA: Diagnosis not present

## 2016-11-13 DIAGNOSIS — Z1239 Encounter for other screening for malignant neoplasm of breast: Secondary | ICD-10-CM

## 2016-11-13 DIAGNOSIS — Z0000 Encounter for general adult medical examination without abnormal findings: Secondary | ICD-10-CM | POA: Diagnosis not present

## 2016-11-13 DIAGNOSIS — Z1211 Encounter for screening for malignant neoplasm of colon: Secondary | ICD-10-CM

## 2016-11-13 LAB — COMPREHENSIVE METABOLIC PANEL
ALBUMIN: 4.6 g/dL (ref 3.5–5.2)
ALK PHOS: 72 U/L (ref 39–117)
ALT: 13 U/L (ref 0–35)
AST: 24 U/L (ref 0–37)
BUN: 14 mg/dL (ref 6–23)
CALCIUM: 9.9 mg/dL (ref 8.4–10.5)
CHLORIDE: 103 meq/L (ref 96–112)
CO2: 31 mEq/L (ref 19–32)
CREATININE: 0.75 mg/dL (ref 0.40–1.20)
GFR: 86.86 mL/min (ref >60.00)
Glucose, Bld: 89 mg/dL (ref 70–99)
POTASSIUM: 4.3 meq/L (ref 3.5–5.1)
Sodium: 139 mEq/L (ref 135–145)
TOTAL PROTEIN: 7.2 g/dL (ref 6.0–8.3)
Total Bilirubin: 0.8 mg/dL (ref 0.2–1.2)

## 2016-11-13 LAB — VITAMIN D 25 HYDROXY (VIT D DEFICIENCY, FRACTURES): VITD: 20.42 ng/mL — AB (ref 30.00–100.00)

## 2016-11-13 MED ORDER — DULOXETINE HCL 30 MG PO CPEP: 30.0000 mg | ORAL_CAPSULE | Freq: Every day | ORAL | 2 refills | Status: DC

## 2016-11-13 NOTE — Patient Instructions (Signed)
I HAVE INCREASED YOUR DOSE OF CYMBALTA TO 30 MG DAILY  I have ordered your mamogram  Referral to Dr Juanita Craver for colonoscopy is in progress   AVOID BOOT CAMPS!  TOO MUCH STRESS ON JOINTS. ONE SIZE DOES NOT FIT ALL  Try icing your knee for 15 minutes several times daily   Health Maintenance for Postmenopausal Women Introduction Menopause is a normal process in which your reproductive ability comes to an end. This process happens gradually over a span of months to years, usually between the ages of 16 and 42. Menopause is complete when you have missed 12 consecutive menstrual periods. It is important to talk with your health care provider about some of the most common conditions that affect postmenopausal women, such as heart disease, cancer, and bone loss (osteoporosis). Adopting a healthy lifestyle and getting preventive care can help to promote your health and wellness. Those actions can also lower your chances of developing some of these common conditions. What should I know about menopause? During menopause, you may experience a number of symptoms, such as:  Moderate-to-severe hot flashes.  Night sweats.  Decrease in sex drive.  Mood swings.  Headaches.  Tiredness.  Irritability.  Memory problems.  Insomnia. Choosing to treat or not to treat menopausal changes is an individual decision that you make with your health care provider. What should I know about hormone replacement therapy and supplements? Hormone therapy products are effective for treating symptoms that are associated with menopause, such as hot flashes and night sweats. Hormone replacement carries certain risks, especially as you become older. If you are thinking about using estrogen or estrogen with progestin treatments, discuss the benefits and risks with your health care provider. What should I know about heart disease and stroke? Heart disease, heart attack, and stroke become more likely as you age. This  may be due, in part, to the hormonal changes that your body experiences during menopause. These can affect how your body processes dietary fats, triglycerides, and cholesterol. Heart attack and stroke are both medical emergencies. There are many things that you can do to help prevent heart disease and stroke:  Have your blood pressure checked at least every 1-2 years. High blood pressure causes heart disease and increases the risk of stroke.  If you are 40-16 years old, ask your health care provider if you should take aspirin to prevent a heart attack or a stroke.  Do not use any tobacco products, including cigarettes, chewing tobacco, or electronic cigarettes. If you need help quitting, ask your health care provider.  It is important to eat a healthy diet and maintain a healthy weight.  Be sure to include plenty of vegetables, fruits, low-fat dairy products, and lean protein.  Avoid eating foods that are high in solid fats, added sugars, or salt (sodium).  Get regular exercise. This is one of the most important things that you can do for your health.  Try to exercise for at least 150 minutes each week. The type of exercise that you do should increase your heart rate and make you sweat. This is known as moderate-intensity exercise.  Try to do strengthening exercises at least twice each week. Do these in addition to the moderate-intensity exercise.  Know your numbers.Ask your health care provider to check your cholesterol and your blood glucose. Continue to have your blood tested as directed by your health care provider. What should I know about cancer screening? There are several types of cancer. Take the following steps  to reduce your risk and to catch any cancer development as early as possible. Breast Cancer  Practice breast self-awareness.  This means understanding how your breasts normally appear and feel.  It also means doing regular breast self-exams. Let your health care  provider know about any changes, no matter how small.  If you are 53 or older, have a clinician do a breast exam (clinical breast exam or CBE) every year. Depending on your age, family history, and medical history, it may be recommended that you also have a yearly breast X-ray (mammogram).  If you have a family history of breast cancer, talk with your health care provider about genetic screening.  If you are at high risk for breast cancer, talk with your health care provider about having an MRI and a mammogram every year.  Breast cancer (BRCA) gene test is recommended for women who have family members with BRCA-related cancers. Results of the assessment will determine the need for genetic counseling and BRCA1 and for BRCA2 testing. BRCA-related cancers include these types:  Breast. This occurs in males or females.  Ovarian.  Tubal. This may also be called fallopian tube cancer.  Cancer of the abdominal or pelvic lining (peritoneal cancer).  Prostate.  Pancreatic. Cervical, Uterine, and Ovarian Cancer  Your health care provider may recommend that you be screened regularly for cancer of the pelvic organs. These include your ovaries, uterus, and vagina. This screening involves a pelvic exam, which includes checking for microscopic changes to the surface of your cervix (Pap test).  For women ages 21-65, health care providers may recommend a pelvic exam and a Pap test every three years. For women ages 92-65, they may recommend the Pap test and pelvic exam, combined with testing for human papilloma virus (HPV), every five years. Some types of HPV increase your risk of cervical cancer. Testing for HPV may also be done on women of any age who have unclear Pap test results.  Other health care providers may not recommend any screening for nonpregnant women who are considered low risk for pelvic cancer and have no symptoms. Ask your health care provider if a screening pelvic exam is right for  you.  If you have had past treatment for cervical cancer or a condition that could lead to cancer, you need Pap tests and screening for cancer for at least 20 years after your treatment. If Pap tests have been discontinued for you, your risk factors (such as having a new sexual partner) need to be reassessed to determine if you should start having screenings again. Some women have medical problems that increase the chance of getting cervical cancer. In these cases, your health care provider may recommend that you have screening and Pap tests more often.  If you have a family history of uterine cancer or ovarian cancer, talk with your health care provider about genetic screening.  If you have vaginal bleeding after reaching menopause, tell your health care provider.  There are currently no reliable tests available to screen for ovarian cancer. Lung Cancer  Lung cancer screening is recommended for adults 56-76 years old who are at high risk for lung cancer because of a history of smoking. A yearly low-dose CT scan of the lungs is recommended if you:  Currently smoke.  Have a history of at least 30 pack-years of smoking and you currently smoke or have quit within the past 15 years. A pack-year is smoking an average of one pack of cigarettes per day for one  year. Yearly screening should:  Continue until it has been 15 years since you quit.  Stop if you develop a health problem that would prevent you from having lung cancer treatment. Colorectal Cancer  This type of cancer can be detected and can often be prevented.  Routine colorectal cancer screening usually begins at age 21 and continues through age 43.  If you have risk factors for colon cancer, your health care provider may recommend that you be screened at an earlier age.  If you have a family history of colorectal cancer, talk with your health care provider about genetic screening.  Your health care provider may also recommend using  home test kits to check for hidden blood in your stool.  A small camera at the end of a tube can be used to examine your colon directly (sigmoidoscopy or colonoscopy). This is done to check for the earliest forms of colorectal cancer.  Direct examination of the colon should be repeated every 5-10 years until age 30. However, if early forms of precancerous polyps or small growths are found or if you have a family history or genetic risk for colorectal cancer, you may need to be screened more often. Skin Cancer  Check your skin from head to toe regularly.  Monitor any moles. Be sure to tell your health care provider:  About any new moles or changes in moles, especially if there is a change in a mole's shape or color.  If you have a mole that is larger than the size of a pencil eraser.  If any of your family members has a history of skin cancer, especially at a young age, talk with your health care provider about genetic screening.  Always use sunscreen. Apply sunscreen liberally and repeatedly throughout the day.  Whenever you are outside, protect yourself by wearing long sleeves, pants, a wide-brimmed hat, and sunglasses. What should I know about osteoporosis? Osteoporosis is a condition in which bone destruction happens more quickly than new bone creation. After menopause, you may be at an increased risk for osteoporosis. To help prevent osteoporosis or the bone fractures that can happen because of osteoporosis, the following is recommended:  If you are 36-30 years old, get at least 1,000 mg of calcium and at least 600 mg of vitamin D per day.  If you are older than age 95 but younger than age 84, get at least 1,200 mg of calcium and at least 600 mg of vitamin D per day.  If you are older than age 18, get at least 1,200 mg of calcium and at least 800 mg of vitamin D per day. Smoking and excessive alcohol intake increase the risk of osteoporosis. Eat foods that are rich in calcium and  vitamin D, and do weight-bearing exercises several times each week as directed by your health care provider. What should I know about how menopause affects my mental health? Depression may occur at any age, but it is more common as you become older. Common symptoms of depression include:  Low or sad mood.  Changes in sleep patterns.  Changes in appetite or eating patterns.  Feeling an overall lack of motivation or enjoyment of activities that you previously enjoyed.  Frequent crying spells. Talk with your health care provider if you think that you are experiencing depression. What should I know about immunizations? It is important that you get and maintain your immunizations. These include:  Tetanus, diphtheria, and pertussis (Tdap) booster vaccine.  Influenza every year before the  flu season begins.  Pneumonia vaccine.  Shingles vaccine. Your health care provider may also recommend other immunizations. This information is not intended to replace advice given to you by your health care provider. Make sure you discuss any questions you have with your health care provider. Document Released: 12/20/2005 Document Revised: 05/17/2016 Document Reviewed: 08/01/2015  2017 [employer]

## 2016-11-13 NOTE — Progress Notes (Signed)
Patient ID: Carolyn Thomas, female    DOB: 1965-11-21  Age: 51 y.o. MRN: [id number]  The patient is here for annual PHYSICAL examination, she has requested a female provider for her physical.  PAP 2016 NEGATIVE HPV,  Atrophic cnangrs noted,  No history of abnormal PAP  Patient travels internationally often and believes she is up to date on vaccines,  married to a physician  MAMMOGRAM NOV 2016 done at  [facility].  DIAGNOSTIC  COLON CANCER SCREENING NEEDED   Discussed referral to outside facility and to a female provider , recommended   DR [facility].      The risk factors are reflected in the social history.  The roster of all physicians providing medical care to patient - is listed in the Snapshot section of the chart.  Home safety : The patient has smoke detectors in the home. They wear seatbelts.  There are no firearms at home. There is no violence in the home.   There is no risks for hepatitis, STDs or HIV. There is no   history of blood transfusion. They have  travel history to infectious disease endemic areas of the world, but not recently .  The patient has seen their dentist in the last six month. They have seen their eye doctor in the last year.    They do not  have excessive sun exposure. Discussed the need for sun protection: hats, long sleeves and use of sunscreen if there is significant sun exposure.   Diet: the importance of a healthy diet is discussed. They do have a healthy diet.  The benefits of regular aerobic exercise were discussed. She exercises 5 timds per week,  60 minutes..   The following portions of the patient's history were reviewed and updated as appropriate: allergies, current medications, past family history, past medical history,  past surgical history, past social history  and problem list.  Visual acuity was not assessed per patient preference since she has regular follow up with her ophthalmologist. Hearing and body mass index were assessed and reviewed.    During the course of the visit the patient was educated and counseled about appropriate screening and preventive services including : fall prevention , diabetes screening, nutrition counseling, colorectal cancer screening, and recommended immunizations.    CC: The primary encounter diagnosis was Breast cancer screening. Diagnoses of Vitamin D deficiency, Fatigue, unspecified type, Colon cancer screening, Encounter for preventive health examination, and Fibromyalgia were also pertinent to this visit.  LAST LIPIDS  2014,  HDL 67 /122  RATIO TOTAL/HDL  2.9   DIAGNOSED WITH FIBROMYALGIA BY PCP COOK LAST MONTH.  ON low dose cymbalta, 20 mg  Has deferred titration of dose but still experiencing daily persistent pain,    History Carolyn Thomas has a past medical history of Fibromyalgia (10/2016) and Migraine.   She has a past surgical history that includes Cesarean section and Tubal ligation.   Her family history includes Heart disease in her father.She reports that she has never smoked. She has never used smokeless tobacco. She reports that she does not drink alcohol or use drugs.  Outpatient Medications Prior to Visit  Medication Sig Dispense Refill  . clonazePAM (KLONOPIN) 0.5 MG tablet TAKE 1 TABLET BY MOUTH AT BEDTIME 90 tablet 0  . conjugated estrogens (PREMARIN) vaginal cream Place 1 Applicatorful vaginally daily. 42.5 g 12  . Multiple Vitamin (MULTIVITAMIN) capsule Take 1 capsule by mouth daily.      Marland Kitchen tretinoin (RETIN-A) 0.1 % cream Apply topically at  bedtime. 45 g 6  . triamcinolone ointment (KENALOG) 0.1 % Apply 1 application topically 2 (two) times daily. 30 g 0  . DULoxetine (CYMBALTA) 20 MG capsule Take 1 capsule (20 mg total) by mouth daily. 30 capsule 11   No facility-administered medications prior to visit.     Review of Systems   Patient denies headache, fevers, malaise, unintentional weight loss, skin rash, eye pain, sinus congestion and sinus pain, sore throat, dysphagia,   hemoptysis , cough, dyspnea, wheezing, chest pain, palpitations, orthopnea, edema, abdominal pain, nausea, melena, diarrhea, constipation, flank pain, dysuria, hematuria, urinary  Frequency, nocturia, numbness, tingling, seizures,  Focal weakness, Loss of consciousness,  Tremor, insomnia, depression, anxiety, and suicidal ideation.      Objective:  BP 100/70   Pulse 81   Temp 98.1 F (36.7 C) (Oral)   Resp 16   Ht 5\' 3"  (1.6 m)   Wt 139 lb 8 oz (63.3 kg)   LMP 11/13/2013   SpO2 95%   BMI 24.71 kg/m   Physical Exam   General appearance: alert, cooperative and appears stated age Head: Normocephalic, without obvious abnormality, atraumatic Eyes: conjunctivae/corneas clear. PERRL, EOM's intact. Fundi benign. Ears: normal TM's and external ear canals both ears Nose: Nares normal. Septum midline. Mucosa normal. No drainage or sinus tenderness. Throat: lips, mucosa, and tongue normal; teeth and gums normal Neck: no adenopathy, no carotid bruit, no JVD, supple, symmetrical, trachea midline and thyroid not enlarged, symmetric, no tenderness/mass/nodules Lungs: clear to auscultation bilaterally Breasts: normal appearance, no masses or tenderness Heart: regular rate and rhythm, S1, S2 normal, no murmur, click, rub or gallop Abdomen: soft, non-tender; bowel sounds normal; no masses,  no organomegaly Extremities: extremities normal, atraumatic, no cyanosis or edema Pulses: 2+ and symmetric Skin: Skin color, texture, turgor normal. No rashes or lesions Neurologic: Alert and oriented X 3, normal strength and tone. Normal symmetric reflexes. Normal coordination and gait.      Assessment & Plan:   Problem List Items Addressed This Visit    Encounter for preventive health examination    Annual comprehensive preventive exam was done as well as an evaluation and management of chronic conditions .  During the course of the visit the patient was educated and counseled about appropriate screening  and preventive services including :  diabetes screening, lipid analysis with projected  10 year  risk for CAD , nutrition counseling, breast, cervical and colorectal cancer screening, and recommended immunizations.  Printed recommendations for health maintenance screenings was given      Fibromyalgia    Encouraged patient to increase cymbalta to 30 mg daily, and to consider massage and acupuncture as adjunctive therapy .  Discussed her recent participation in a "boot camp" which resulted in increased low back and right knee pain.  Recommended lower impact forms of exercise that were more geared for toning and stretching.       Vitamin D deficiency    Level is low at 20.  Will rx drisdol 50,000 IUs weekly x 3 months.       Relevant Medications   ergocalciferol (DRISDOL) 50000 units capsule   Other Relevant Orders   VITAMIN D 25 Hydroxy (Vit-D Deficiency, Fractures) (Completed)    Other Visit Diagnoses    Breast cancer screening    -  Primary   Relevant Orders   MM DIGITAL SCREENING BILATERAL   Fatigue, unspecified type       Relevant Orders   Comprehensive metabolic panel (Completed)   Colon cancer  screening       Relevant Orders   Ambulatory referral to Gastroenterology      I have changed Ms. Coin DULoxetine. I am also having her start on ergocalciferol. Additionally, I am having her maintain her multivitamin, conjugated estrogens, triamcinolone ointment, tretinoin, and clonazePAM.  Meds ordered this encounter  Medications  . DULoxetine (CYMBALTA) 30 MG capsule    Sig: Take 1 capsule (30 mg total) by mouth daily.    Dispense:  30 capsule    Refill:  2  . ergocalciferol (DRISDOL) 50000 units capsule    Sig: Take 1 capsule (50,000 Units total) by mouth once a week.    Dispense:  4 capsule    Refill:  3    Medications Discontinued During This Encounter  Medication Reason  . DULoxetine (CYMBALTA) 20 MG capsule Reorder    Follow-up: No Follow-up on file.   Sherlene Shams, MD

## 2016-11-13 NOTE — Progress Notes (Signed)
Pre-visit discussion using our clinic review tool. No additional management support is needed unless otherwise documented below in the visit note.  

## 2016-11-16 DIAGNOSIS — E559 Vitamin D deficiency, unspecified: Secondary | ICD-10-CM | POA: Insufficient documentation

## 2016-11-16 MED ORDER — ERGOCALCIFEROL 1.25 MG (50000 UT) PO CAPS: 50000.0000 [IU] | ORAL_CAPSULE | ORAL | 3 refills | Status: DC

## 2016-11-16 NOTE — Assessment & Plan Note (Addendum)
Encouraged patient to increase cymbalta to 30 mg daily, and to consider massage and acupuncture as adjunctive therapy .  Discussed her recent participation in a "boot camp" which resulted in increased low back and right knee pain.  Recommended lower impact forms of exercise that were more geared for toning and stretching.

## 2016-11-16 NOTE — Assessment & Plan Note (Signed)
Annual comprehensive preventive exam was done as well as an evaluation and management of chronic conditions .  During the course of the visit the patient was educated and counseled about appropriate screening and preventive services including :  diabetes screening, lipid analysis with projected  10 year  risk for CAD , nutrition counseling, breast, cervical and colorectal cancer screening, and recommended immunizations.  Printed recommendations for health maintenance screenings was given 

## 2016-11-16 NOTE — Assessment & Plan Note (Signed)
Level is low at 20.  Will rx drisdol 50,000 IUs weekly x 3 months.

## 2016-11-21 ENCOUNTER — Encounter: Payer: BLUE CROSS/BLUE SHIELD | Admitting: Internal Medicine

## 2016-11-21 NOTE — Progress Notes (Signed)
Unable to deliver message to patient will sent out letter

## 2016-11-25 ENCOUNTER — Telehealth: Payer: Self-pay | Admitting: Family Medicine

## 2016-11-25 NOTE — Telephone Encounter (Signed)
Pt called returning your call in regards to medication. Please advise, thank you!  Call pt @ [phone]

## 2016-11-25 NOTE — Telephone Encounter (Signed)
Patient notified of Labs

## 2016-12-23 ENCOUNTER — Other Ambulatory Visit: Payer: Self-pay | Admitting: Family Medicine

## 2016-12-24 NOTE — Telephone Encounter (Signed)
Refilled on 09/10/2016. Saw Dr. Dan HumphreysWalker on 02/15/2016 and then saw Dr. Darrick Huntsmanullo on 11/13/2016. Pt does not have an appt scheduled.

## 2016-12-24 NOTE — Telephone Encounter (Signed)
faxed

## 2017-01-27 ENCOUNTER — Telehealth: Payer: Self-pay | Admitting: Family Medicine

## 2017-01-27 NOTE — Telephone Encounter (Signed)
Pt called about having some Dizziness and off balance today. I offered to transfer pt to nurse line she did not want to be transferred she stated that she did not feel like being asked a whole lot questions and she knows her body and her husband is a physician. Then I offered a appt for tomorrow she said the issue will be gone by then and did not want to see another provider. I then tried to still offer her to speak to the nurse line she still did not want to speak to the nurse line.

## 2017-01-30 DIAGNOSIS — Z1211 Encounter for screening for malignant neoplasm of colon: Secondary | ICD-10-CM | POA: Diagnosis not present

## 2017-01-30 DIAGNOSIS — K648 Other hemorrhoids: Secondary | ICD-10-CM | POA: Diagnosis not present

## 2017-01-30 DIAGNOSIS — K6389 Other specified diseases of intestine: Secondary | ICD-10-CM | POA: Diagnosis not present

## 2017-01-30 DIAGNOSIS — K620 Anal polyp: Secondary | ICD-10-CM | POA: Diagnosis not present

## 2017-01-30 DIAGNOSIS — K5289 Other specified noninfective gastroenteritis and colitis: Secondary | ICD-10-CM | POA: Diagnosis not present

## 2017-01-30 DIAGNOSIS — K621 Rectal polyp: Secondary | ICD-10-CM | POA: Diagnosis not present

## 2017-01-30 DIAGNOSIS — K640 First degree hemorrhoids: Secondary | ICD-10-CM | POA: Diagnosis not present

## 2017-01-30 LAB — HM COLONOSCOPY

## 2017-02-07 ENCOUNTER — Other Ambulatory Visit: Payer: Self-pay | Admitting: Internal Medicine

## 2017-02-12 DIAGNOSIS — F411 Generalized anxiety disorder: Secondary | ICD-10-CM | POA: Diagnosis not present

## 2017-02-12 DIAGNOSIS — E559 Vitamin D deficiency, unspecified: Secondary | ICD-10-CM | POA: Diagnosis not present

## 2017-02-12 DIAGNOSIS — K279 Peptic ulcer, site unspecified, unspecified as acute or chronic, without hemorrhage or perforation: Secondary | ICD-10-CM | POA: Diagnosis not present

## 2017-03-07 ENCOUNTER — Other Ambulatory Visit: Payer: Self-pay | Admitting: Internal Medicine

## 2017-03-07 DIAGNOSIS — E559 Vitamin D deficiency, unspecified: Secondary | ICD-10-CM

## 2017-03-10 ENCOUNTER — Other Ambulatory Visit: Payer: Self-pay

## 2017-03-24 DIAGNOSIS — K279 Peptic ulcer, site unspecified, unspecified as acute or chronic, without hemorrhage or perforation: Secondary | ICD-10-CM | POA: Diagnosis not present

## 2017-03-24 DIAGNOSIS — E559 Vitamin D deficiency, unspecified: Secondary | ICD-10-CM | POA: Diagnosis not present

## 2017-03-24 DIAGNOSIS — F411 Generalized anxiety disorder: Secondary | ICD-10-CM | POA: Diagnosis not present

## 2017-08-04 ENCOUNTER — Other Ambulatory Visit: Payer: Self-pay

## 2017-08-04 MED ORDER — ESTROGENS, CONJUGATED 0.625 MG/GM VA CREA: 1.0000 | TOPICAL_CREAM | Freq: Every day | VAGINAL | 12 refills | Status: DC

## 2017-08-04 NOTE — Telephone Encounter (Signed)
Last office visit 11/13/16 CPE  No follow up on file Last filled 02/15/16   Script written by Dr Dan Humphreys

## 2017-09-15 DIAGNOSIS — F411 Generalized anxiety disorder: Secondary | ICD-10-CM | POA: Diagnosis not present

## 2018-07-23 ENCOUNTER — Ambulatory Visit: Payer: BLUE CROSS/BLUE SHIELD | Admitting: Obstetrics and Gynecology

## 2018-07-23 ENCOUNTER — Encounter: Payer: Self-pay | Admitting: Obstetrics and Gynecology

## 2018-07-23 VITALS — BP 110/64 | HR 82 | Ht 64.0 in | Wt 148.0 lb

## 2018-07-23 DIAGNOSIS — N941 Unspecified dyspareunia: Secondary | ICD-10-CM

## 2018-07-23 DIAGNOSIS — N952 Postmenopausal atrophic vaginitis: Secondary | ICD-10-CM

## 2018-07-23 MED ORDER — PRASTERONE 6.5 MG VA INST: 6.5000 mg | VAGINAL_INSERT | Freq: Every day | VAGINAL | 11 refills | Status: DC
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
## 2018-12-17 ENCOUNTER — Encounter: Payer: Self-pay | Admitting: Obstetrics and Gynecology

## 2018-12-17 ENCOUNTER — Ambulatory Visit (INDEPENDENT_AMBULATORY_CARE_PROVIDER_SITE_OTHER): Payer: BLUE CROSS/BLUE SHIELD | Admitting: Obstetrics and Gynecology

## 2018-12-17 VITALS — BP 100/58 | HR 72 | Ht 64.0 in | Wt 147.0 lb

## 2018-12-17 DIAGNOSIS — N941 Unspecified dyspareunia: Secondary | ICD-10-CM

## 2018-12-17 DIAGNOSIS — N952 Postmenopausal atrophic vaginitis: Secondary | ICD-10-CM | POA: Diagnosis not present

## 2018-12-17 NOTE — Progress Notes (Signed)
Patient ID: Carolyn Thomas, female   DOB: [birth year], 53 y.o.   MRN: [id number]  Reason for Consult: Consult (Menopausal symptoms, hot flashes, and vaginal dryness)   Referred by Tommie Samsook, Jayce G, DO  Subjective:     HPI:  Carolyn SarinSamira Derusha is a 53 y.o. female. She is following up for dyspareunia. She has used  [place]Intrarosa with some improvement but would like to avoid creams.  She reports her LMP is 3 years ago. She does not have other symptoms of menopause such as irritability, sleep disturbances, or hot flashes.   Past Medical History:  Diagnosis Date  . Anxiety   . Fibromyalgia 10/2016  . Migraine   . Stomach ulcer    Family History  Problem Relation Age of Onset  . Heart disease Father        age 53   Past Surgical History:  Procedure Laterality Date  . CESAREAN SECTION     x2  . TUBAL LIGATION      Short Social History:  Social History   Tobacco Use  . Smoking status: Never Smoker  . Smokeless tobacco: Never Used  Substance Use Topics  . Alcohol use: No    No Known Allergies  Current Outpatient Medications  Medication Sig Dispense Refill  . clonazePAM (KLONOPIN) 0.5 MG tablet TAKE 1 TABLET BY MOUTH AT BEDTIME 90 tablet 0  . DULoxetine (CYMBALTA) 30 MG capsule TAKE 1 CAPSULE (30 MG TOTAL) BY MOUTH DAILY. 30 capsule 2  . Multiple Vitamin (MULTIVITAMIN) capsule Take 1 capsule by mouth daily.      Marland Kitchen. tretinoin (RETIN-A) 0.1 % cream Apply topically at bedtime. 45 g 6  . conjugated estrogens (PREMARIN) vaginal cream Place 1 Applicatorful vaginally daily. (Patient not taking: Reported on 07/23/2018) 42.5 g 12  . triamcinolone ointment (KENALOG) 0.1 % Apply 1 application topically 2 (two) times daily. (Patient not taking: Reported on 12/17/2018) 30 g 0   No current facility-administered medications for this visit.     Review of Systems  Constitutional: Negative for chills, fatigue, fever and unexpected weight change.  HENT: Negative for trouble swallowing.  Eyes: Negative for  loss of vision.  Respiratory: Negative for cough, shortness of breath and wheezing.  Cardiovascular: Negative for chest pain, leg swelling, palpitations and syncope.  GI: Negative for abdominal pain, blood in stool, diarrhea, nausea and vomiting.  GU: Negative for difficulty urinating, dysuria, frequency and hematuria.  Musculoskeletal: Negative for back pain, leg pain and joint pain.  Skin: Negative for rash.  Neurological: Negative for dizziness, headaches, light-headedness, numbness and seizures.  Psychiatric: Negative for behavioral problem, confusion, depressed mood and sleep disturbance.        Objective:  Objective   Vitals:   12/17/18 1331  BP: (!) 100/58  Pulse: 72  Weight: 147 lb (66.7 kg)  Height: 5\' 4"  (1.626 m)   Body mass index is 25.23 kg/m.  Physical Exam Vitals signs and nursing note reviewed.  Constitutional:      Appearance: She is well-developed.  HENT:     Head: Normocephalic and atraumatic.  Eyes:     Pupils: Pupils are equal, round, and reactive to light.  Cardiovascular:     Rate and Rhythm: Normal rate and regular rhythm.  Pulmonary:     Effort: Pulmonary effort is normal. No respiratory distress.  Skin:    General: Skin is warm and dry.  Neurological:     Mental Status: She is alert and oriented to person, place, and time.  Psychiatric:  Behavior: Behavior normal.        Thought Content: Thought content normal.        Judgment: Judgment normal.        Assessment/Plan:     53 yo with dyspareunia   Reviewed options for treatment of menopause related atrophy including hormonal therapy and Laser CO2 therapy.  [facility], [place] that offers this service.  She will follow up with  Will need pap at next visit.   More than 25 minutes were spent face to face with the patient in the room with more than 50% of the time spent providing counseling and discussing the plan of management.      Adelene Idlerhristanna Maryl Blalock MD [facility], [facility] 12/17/2018 3:09 PM

## 2020-05-23 ENCOUNTER — Ambulatory Visit (INDEPENDENT_AMBULATORY_CARE_PROVIDER_SITE_OTHER): Payer: Self-pay | Admitting: Dermatology

## 2020-05-23 ENCOUNTER — Other Ambulatory Visit: Payer: Self-pay

## 2020-05-23 DIAGNOSIS — L988 Other specified disorders of the skin and subcutaneous tissue: Secondary | ICD-10-CM

## 2020-05-23 NOTE — Progress Notes (Signed)
   Follow-Up Visit   Subjective  Carolyn Thomas is a 54 y.o. female who presents for the following: Facial Elastosis (patient is here today for Botox - she was last injected in [place] about 3 or so months ago. She would also like to discuss tear trough fillers).  The following portions of the chart were reviewed this encounter and updated as appropriate:  Tobacco  Allergies  Meds  Problems  Med Hx  Surg Hx  Fam Hx     Review of Systems:  No other skin or systemic complaints except as noted in HPI or Assessment and Plan.  Objective  Well appearing patient in no apparent distress; mood and affect are within normal limits.  A focused examination was performed including the face . Relevant physical exam findings are noted in the Assessment and Plan.  Objective  Face: Rhytides and volume loss.   Images               Assessment & Plan    Elastosis of skin Face  Discussed Belotero for the tear trough areas.  She may scheudle for this.  Do not recommend Botox at this time as patient is relatively smooth and may have had Botox very recently.  We may do Botox at [facility] appt if indicated. Also discussed BBL laser for dyschromia and wrinkles and spots of face.  She will get consultation with Boneta Lucks to ensure she is a candidate with her darker skin.   Return for cosmetic - Belotero for the tear trough areas.  [name], CMA, am acting as scribe for [name], MD .  Documentation: I have reviewed the above documentation for accuracy and completeness, and I agree with the above.  [name], MD

## 2020-05-24 ENCOUNTER — Encounter: Payer: Self-pay | Admitting: Dermatology

## 2020-07-13 ENCOUNTER — Ambulatory Visit: Payer: Self-pay | Admitting: Dermatology

## 2021-01-19 DIAGNOSIS — F4312 Post-traumatic stress disorder, chronic: Secondary | ICD-10-CM | POA: Diagnosis not present

## 2021-03-28 DIAGNOSIS — Z20822 Contact with and (suspected) exposure to covid-19: Secondary | ICD-10-CM | POA: Diagnosis not present

## 2021-03-28 DIAGNOSIS — Z03818 Encounter for observation for suspected exposure to other biological agents ruled out: Secondary | ICD-10-CM | POA: Diagnosis not present

## 2021-04-12 ENCOUNTER — Ambulatory Visit: Payer: BC Managed Care – PPO | Admitting: Dermatology

## 2021-04-12 ENCOUNTER — Other Ambulatory Visit: Payer: Self-pay

## 2021-04-12 DIAGNOSIS — L309 Dermatitis, unspecified: Secondary | ICD-10-CM

## 2021-04-12 DIAGNOSIS — L089 Local infection of the skin and subcutaneous tissue, unspecified: Secondary | ICD-10-CM | POA: Diagnosis not present

## 2021-04-12 DIAGNOSIS — Z9109 Other allergy status, other than to drugs and biological substances: Secondary | ICD-10-CM | POA: Diagnosis not present

## 2021-04-12 MED ORDER — PIMECROLIMUS 1 % EX CREA: TOPICAL_CREAM | Freq: Two times a day (BID) | CUTANEOUS | 0 refills | Status: DC

## 2021-04-12 MED ORDER — DOXYCYCLINE MONOHYDRATE 100 MG PO TABS: 100.0000 mg | ORAL_TABLET | Freq: Two times a day (BID) | ORAL | 0 refills | Status: AC

## 2021-04-12 MED ORDER — MUPIROCIN 2 % EX OINT: TOPICAL_OINTMENT | CUTANEOUS | 0 refills | Status: DC

## 2021-04-12 NOTE — Progress Notes (Signed)
New Patient Visit   Subjective  Carolyn Thomas is a 55 y.o. female who presents for the following: Rash (Patient c/o persistent rash on the eyelids ). Rash first appeared a few weeks ago and it was accompanied by an eye infection for which she used Olopatadine eye drops for the eyes and Eucerin lotion on the eyelids. Rash and infection cleared for a few weeks, but then the rash returned about two days ago. Patient brought her daily cosmetic products with her today to see if the doctor thinks it may be one of the products she's using. The patient also has a history of sun sensitivity which includes burning, itching, and stinging of the skin and she would like to discuss treatment options. She also noticed pain and drainage at her right ear piercing site and is concerned the rash and infection of her ear may be related.   The following portions of the chart were reviewed this encounter and updated as appropriate:   Tobacco  Allergies  Meds  Problems  Med Hx  Surg Hx  Fam Hx      Review of Systems:  No other skin or systemic complaints except as noted in HPI or Assessment and Plan.  Objective  Well appearing patient in no apparent distress; mood and affect are within normal limits.  A focused examination was performed including the face. Relevant physical exam findings are noted in the Assessment and Plan.  B/L eyelids Erythematous patches at eyelids  Face, trunk, extremities Skin clear today but history of burning, stinging, and itching of the skin during and after sun exposure.  Right Ear Crusted erosion around piercing site  Assessment & Plan  Dermatitis B/L eyelids  Has history of previous episodes of minor irritation over the years.   Eyelid dermatitis - likely secondary to rubbing in the setting of seasonal allergies or irritant contact dermatitis > allergic contact dermatitis from eyelash extensions  Start Elidel cream QD-BID PRN. Reviewed risk of burning sensation when  first starting treatment with elidel. Discussed with patient to avoid rubbing and touching eyelid area to prevent inflammation.   Ok to continue Olopatadine eyedrops for itching and irritation of the eyes. May also consider OTC Ketotifen   Consider patch testing in the future if condition doesn't improve, worsens, or persists.   Chronic condition with duration or expected duration over one year. Condition is bothersome to patient. Currently flared.   pimecrolimus (ELIDEL) 1 % cream - B/L eyelids Apply topically 2 (two) times daily. Apply to rash BID PRN.  Sensitivity to sunlight Face, trunk, extremities  Start Heliocare supplement. Recommend taking Heliocare sun protection supplement daily in sunny weather for additional sun protection. For maximum protection on the sunniest days, you can take up to 2 capsules of regular Heliocare OR take 1 capsule of Heliocare Ultra. For prolonged exposure (such as a full day in the sun), you can repeat your dose of the supplement 4 hours after your first dose. Heliocare can be purchased at [facility] or at [email or web address].   Start Elidel cream to aa's QD-BID PRN.    Local infection of skin and subcutaneous tissue Right Ear  Start Mupirocin 2% ointment TID. Take out earring and soak in ETOH. Clean front and back of ear with Bactine or Hibiclens. When reapplying the earring apply a thin layer of Mupirocin ointment on it before insertion.   If getting worse tomorrow or over the weekend start Doxycycline 100mg  po QD x 7 days. Doxycycline should be  taken with food to prevent nausea. Do not lay down for 30 minutes after taking. Be cautious with sun exposure and use good sun protection while on this medication. Pregnant women should not take this medication.    mupirocin ointment (BACTROBAN) 2 % - Right Ear Apply to aa ear TID until healed.  doxycycline (ADOXA) 100 MG tablet - Right Ear Take 1 tablet (100 mg total) by mouth 2 (two) times daily  for 7 days. Take with food and wait 30 mins to lie down use good sun protection.  Anaerobic and Aerobic Culture - Right Ear  Return for follow up in 2-3 months - eyelid dermatitis, sunlight sensitivity .  [name], CMA, am acting as scribe for Darden Dates, MD .  Documentation: I have reviewed the above documentation for accuracy and completeness, and I agree with the above.  Darden Dates, MD

## 2021-04-12 NOTE — Patient Instructions (Addendum)
If you have any questions or concerns for your doctor, please call our main line at [phone] and press option 4 to reach your doctor's medical assistant. If no one answers, please leave a voicemail as directed and we will return your call as soon as possible. Messages left after 4 pm will be answered the following business day.   You may also send [place] a message via [place]. We typically respond to MyChart messages within 1-2 business days.  For prescription refills, please ask your pharmacy to contact our office. Our fax number is [phone].  If you have an urgent issue when the clinic is closed that cannot wait until the next business day, you can page your doctor at the number below.    Please note that while we do our best to be available for urgent issues outside of office hours, we are not available 24/7.   If you have an urgent issue and are unable to reach [place], you may choose to seek medical care at your doctor's office, retail clinic, urgent care center, or emergency room.  If you have a medical emergency, please immediately call 911 or go to the emergency department.  Pager Numbers  - Dr. Nehemiah Massed: [phone]  - Dr. Laurence Ferrari: [phone]  - Dr. Nicole Kindred: [phone]  In the event of inclement weather, please call our main line at [phone] for an update on the status of any delays or closures.  Dermatology Medication Tips: Please keep the boxes that topical medications come in in order to help keep track of the instructions about where and how to use these. Pharmacies typically print the medication instructions only on the boxes and not directly on the medication tubes.   If your medication is too expensive, please contact our office at [phone] option 4 or send [place] a message through [place].   We are unable to tell what your co-pay for medications will be in advance as this is different depending on your insurance coverage. However, we may be able to find a substitute  medication at lower cost or fill out paperwork to get insurance to cover a needed medication.   If a prior authorization is required to get your medication covered by your insurance company, please allow [place] 1-2 business days to complete this process.  Drug prices often vary depending on where the prescription is filled and some pharmacies may offer cheaper prices.  The website www.goodrx.com contains coupons for medications through different pharmacies. The prices here do not account for what the cost may be with help from insurance (it may be cheaper with your insurance), but the website can give you the price if you did not use any insurance.  - You can print the associated coupon and take it with your prescription to the pharmacy.  - You may also stop by our office during regular business hours and pick up a GoodRx coupon card.  - If you need your prescription sent electronically to a different pharmacy, notify our office through [facility] or by phone at [phone] option 4.  Recommend taking Heliocare sun protection supplement daily in sunny weather for additional sun protection. For maximum protection on the sunniest days, you can take up to 2 capsules of regular Heliocare OR take 1 capsule of Heliocare Ultra. For prolonged exposure (such as a full day in the sun), you can repeat your dose of the supplement 4 hours after your first dose. Heliocare can be purchased at [facility] or at [email or web address].  Clean earring and ear with alcohol, Bactine, or Hibiclens. All are available over the counter.

## 2021-04-18 ENCOUNTER — Telehealth: Payer: Self-pay

## 2021-04-18 LAB — ANAEROBIC AND AEROBIC CULTURE

## 2021-04-18 NOTE — Telephone Encounter (Signed)
Patient advised of culture results.  

## 2021-04-18 NOTE — Telephone Encounter (Signed)
-----   Message from Sandi Mealy, MD sent at 04/18/2021  8:19 AM EDT ----- Culture from ear grew normal skin bacteria. Would recommend continuing mupirocin until completely healed. Please check on patient to ensure her ear is healing well.  If she has pain or drainage, please let me know.  Thank you!

## 2021-04-23 ENCOUNTER — Encounter: Payer: Self-pay | Admitting: Dermatology

## 2021-05-07 DIAGNOSIS — F4312 Post-traumatic stress disorder, chronic: Secondary | ICD-10-CM | POA: Diagnosis not present

## 2021-05-08 ENCOUNTER — Ambulatory Visit: Payer: BC Managed Care – PPO | Admitting: Dermatology

## 2021-05-08 ENCOUNTER — Other Ambulatory Visit: Payer: Self-pay

## 2021-05-08 ENCOUNTER — Encounter: Payer: Self-pay | Admitting: Dermatology

## 2021-05-08 DIAGNOSIS — R21 Rash and other nonspecific skin eruption: Secondary | ICD-10-CM

## 2021-05-08 DIAGNOSIS — L309 Dermatitis, unspecified: Secondary | ICD-10-CM

## 2021-05-08 MED ORDER — TRIAMCINOLONE ACETONIDE 0.1 % EX CREA: 1.0000 "application " | TOPICAL_CREAM | Freq: Two times a day (BID) | CUTANEOUS | 1 refills | Status: DC | PRN

## 2021-05-08 MED ORDER — HYDROCORTISONE 2.5 % EX OINT: TOPICAL_OINTMENT | CUTANEOUS | 0 refills | Status: DC

## 2021-05-08 NOTE — Progress Notes (Signed)
Follow-Up Visit   Subjective  Carolyn Thomas is a 55 y.o. female who presents for the following: Dermatitis (Patient here today for dermatitis worsening. Patient was seen about 4 weeks ago for eyelid dermatitis and was given rx for Elidel, which she has been using 1-2 times daily. Patient advises rash has worsened and has spread to neck. She is having itching and pain, especially around eyes. ).  No new medications. Patient has had sensitivity to sun for years. Patient advises she does wear sunscreen daily and has heavy tint on car windows.   The following portions of the chart were reviewed this encounter and updated as appropriate:   Tobacco  Allergies  Meds  Problems  Med Hx  Surg Hx  Fam Hx       Review of Systems:  No other skin or systemic complaints except as noted in HPI or Assessment and Plan.  Objective  Well appearing patient in no apparent distress; mood and affect are within normal limits.  A focused examination was performed including arms, face, neck. Relevant physical exam findings are noted in the Assessment and Plan.  Erythematous patches at face, arms  bilateral eyelids, right neck Scaly red patches   Assessment & Plan  Dermatitis bilateral eyelids, right neck  Start HC 2.5% ointment twice daily for up to 1 week to eyelids. Avoid rubbing eyes. Avoid eye makeup when possible.   Start TMC 0.1% cream twice daily for up to 2 weeks to areas at arms and neck. Avoid applying to face, groin, and axilla. Use as directed. Risk of skin atrophy with long-term use reviewed.   Recommend plain Vaseline to lips. Can also use hydrocortisone there twice a day up to a week if needed and can use Elidel in that area long-term if needed.  Topical steroids (such as triamcinolone, fluocinolone, fluocinonide, mometasone, clobetasol, halobetasol, betamethasone, hydrocortisone) can cause thinning and lightening of the skin if they are used for too long in the same area. Your  physician has selected the right strength medicine for your problem and area affected on the body. Please use your medication only as directed by your physician to prevent side effects.   Recommend patch testing. Consider referring to [facility] if no allergens identified in patch testing inpatient not doing well.  36 T.R.U.E. patch tests applied to back today. Pt advised to keep back as dry as possible and to avoid excessive sweating until tests removed on 05/10/21. /CW  Gentle skin care attached to patient's AVS. recommend Free and clear laundry products.    triamcinolone cream (KENALOG) 0.1 % - bilateral eyelids, right neck Apply 1 application topically 2 (two) times daily as needed. To affected areas at arms for up to 2 weeks as needed for rash. Avoid applying to face, groin, and axilla. Use as directed. Risk of skin atrophy with long-term use reviewed.  hydrocortisone 2.5 % ointment - bilateral eyelids, right neck Twice daily to eyelids for up to 1 week.  Related Procedures Patch Test  Related Medications pimecrolimus (ELIDEL) 1 % cream Apply topically 2 (two) times daily. Apply to rash BID PRN.  Rash and other nonspecific skin eruption  Patient with history of extreme sensitivity to sun exposure since early adulthood.  She develops a burning tender red rash after brief sun exposure without sunscreen.  Takes low-dose of Cymbalta (which can rarely cause sun sensitivity) every few days and is weaning off.  No other known photosensitizing medications.  Favor PMLE.  Recommend increasing photoprotection by adding  Heliocare ultra or double dose of regular Heliocare to see if this is sufficient to keep this under better control for her.  Can use triamcinolone, hydrocortisone, and Elidel as for dermatitis as needed.  Chronic condition with duration over one year. Condition is bothersome to patient. Not currently at goal.   Erosions Patient with recurrent fissure at right ear Possibly  secondary to impetigo vs irritant contact dermatitis or other Recommend mupirocin 3 times a day as needed when area opens up.  Will monitor  Follow-up for patch testing read in 2 days and in 1 week  Carolyn Thomas, RMA, am acting as scribe for Darden Dates, MD .  Documentation: I have reviewed the above documentation for accuracy and completeness, and I agree with the above.  Darden Dates, MD

## 2021-05-08 NOTE — Patient Instructions (Addendum)
Recommend taking Heliocare sun protection supplement daily in sunny weather for additional sun protection. For maximum protection on the sunniest days, you can take up to 2 capsules of regular Heliocare OR take 1 capsule of Heliocare Ultra. For prolonged exposure (such as a full day in the sun), you can repeat your dose of the supplement 4 hours after your first dose. Heliocare can be purchased at [facility] or at [email or web address].    Recommend daily broad spectrum sunscreen SPF 30+ to sun-exposed areas, reapply every 2 hours as needed. Call for new or changing lesions.  Staying in the shade or wearing long sleeves, sun glasses (UVA+UVB protection) and wide brim hats (4-inch brim around the entire circumference of the hat) are also recommended for sun protection.   Start HC 2.5% ointment twice daily for up to 1 week to eyelids. Start TMC 0.1% cream twice daily for up to 2 weeks to areas at arms. Avoid applying to face, groin, and axilla. Use as directed. Risk of skin atrophy with long-term use reviewed.   Topical steroids (such as triamcinolone, fluocinolone, fluocinonide, mometasone, clobetasol, halobetasol, betamethasone, hydrocortisone) can cause thinning and lightening of the skin if they are used for too long in the same area. Your physician has selected the right strength medicine for your problem and area affected on the body. Please use your medication only as directed by your physician to prevent side effects.    Gentle Skin Care Guide  1. Bathe no more than once a day.  2. Avoid bathing in hot water  3. Use a mild soap like Dove, Vanicream, Cetaphil, CeraVe. Can use Lever 2000 or Cetaphil antibacterial soap  4. Use soap only where you need it. On most days, use it under your arms, between your legs, and on your feet. Let the water rinse other areas unless visibly dirty.  5. When you get out of the bath/shower, use a towel to gently blot your skin dry, don't rub it.  6.  While your skin is still a little damp, apply a moisturizing cream such as Vanicream, CeraVe, Cetaphil, Eucerin, Sarna lotion or plain Vaseline Jelly. For hands apply Neutrogena [place] Hand Cream or Excipial Hand Cream.  7. Reapply moisturizer any time you start to itch or feel dry.  8. Sometimes using free and clear laundry detergents can be helpful. Fabric softener sheets should be avoided. Downy Free & Gentle liquid, or any liquid fabric softener that is free of dyes and perfumes, it acceptable to use  9. If your doctor has given you prescription creams you may apply moisturizers over them    If you have any questions or concerns for your doctor, please call our main line at [phone] and press option 4 to reach your doctor's medical assistant. If no one answers, please leave a voicemail as directed and we will return your call as soon as possible. Messages left after 4 pm will be answered the following business day.   You may also send [place] a message via MyChart. We typically respond to MyChart messages within 1-2 business days.  For prescription refills, please ask your pharmacy to contact our office. Our fax number is [phone].  If you have an urgent issue when the clinic is closed that cannot wait until the next business day, you can page your doctor at the number below.    Please note that while we do our best to be available for urgent issues outside of office hours, we are not available 24/7.  If you have an urgent issue and are unable to reach [place], you may choose to seek medical care at your doctor's office, retail clinic, urgent care center, or emergency room.  If you have a medical emergency, please immediately call 911 or go to the emergency department.  Pager Numbers  - Dr. Nehemiah Massed: [phone]  - Dr. Laurence Ferrari: [phone]  - Dr. Nicole Kindred: [phone]  In the event of inclement weather, please call our main line at [phone] for an update on the status of any  delays or closures.  Dermatology Medication Tips: Please keep the boxes that topical medications come in in order to help keep track of the instructions about where and how to use these. Pharmacies typically print the medication instructions only on the boxes and not directly on the medication tubes.   If your medication is too expensive, please contact our office at [phone] option 4 or send [place] a message through [place].   We are unable to tell what your co-pay for medications will be in advance as this is different depending on your insurance coverage. However, we may be able to find a substitute medication at lower cost or fill out paperwork to get insurance to cover a needed medication.   If a prior authorization is required to get your medication covered by your insurance company, please allow [place] 1-2 business days to complete this process.  Drug prices often vary depending on where the prescription is filled and some pharmacies may offer cheaper prices.  The website www.goodrx.com contains coupons for medications through different pharmacies. The prices here do not account for what the cost may be with help from insurance (it may be cheaper with your insurance), but the website can give you the price if you did not use any insurance.  - You can print the associated coupon and take it with your prescription to the pharmacy.  - You may also stop by our office during regular business hours and pick up a GoodRx coupon card.  - If you need your prescription sent electronically to a different pharmacy, notify our office through [facility] or by phone at [phone] option 4.

## 2021-05-10 ENCOUNTER — Ambulatory Visit (INDEPENDENT_AMBULATORY_CARE_PROVIDER_SITE_OTHER): Payer: BC Managed Care – PPO

## 2021-05-10 ENCOUNTER — Other Ambulatory Visit: Payer: Self-pay

## 2021-05-10 DIAGNOSIS — R21 Rash and other nonspecific skin eruption: Secondary | ICD-10-CM

## 2021-05-10 NOTE — Progress Notes (Signed)
Patient here for day 3 of patch test.   True Test X36 removed from back. Panel two was slightly twisted and lifted from skin. Sights 17, 18, 23 and 24 could be invalid.   Patient had no reactions today.  Patient shown how to read and review testing until follow up next Tuesday. Patient advised do not soak nor scrub test areas.

## 2021-05-15 ENCOUNTER — Ambulatory Visit: Payer: BC Managed Care – PPO | Admitting: Dermatology

## 2021-05-15 ENCOUNTER — Other Ambulatory Visit: Payer: Self-pay

## 2021-05-15 DIAGNOSIS — R21 Rash and other nonspecific skin eruption: Secondary | ICD-10-CM | POA: Diagnosis not present

## 2021-05-15 DIAGNOSIS — L821 Other seborrheic keratosis: Secondary | ICD-10-CM | POA: Diagnosis not present

## 2021-05-15 NOTE — Patient Instructions (Addendum)
If you have any questions or concerns for your doctor, please call our main line at [phone] and press option 4 to reach your doctor's medical assistant. If no one answers, please leave a voicemail as directed and we will return your call as soon as possible. Messages left after 4 pm will be answered the following business day.   You may also send us a message via MyChart. We typically respond to MyChart messages within 1-2 business days.  For prescription refills, please ask your pharmacy to contact our office. Our fax number is [phone].  If you have an urgent issue when the clinic is closed that cannot wait until the next business day, you can page your doctor at the number below.    Please note that while we do our best to be available for urgent issues outside of office hours, we are not available 24/7.   If you have an urgent issue and are unable to reach us, you may choose to seek medical care at your doctor's office, retail clinic, urgent care center, or emergency room.  If you have a medical emergency, please immediately call 911 or go to the emergency department.  Pager Numbers  - Dr. Kowalski: [phone]  - Dr. Moye: [phone]  - Dr. Stewart: [phone]  In the event of inclement weather, please call our main line at [phone] for an update on the status of any delays or closures.  Dermatology Medication Tips: Please keep the boxes that topical medications come in in order to help keep track of the instructions about where and how to use these. Pharmacies typically print the medication instructions only on the boxes and not directly on the medication tubes.    Recommend taking Heliocare sun protection supplement daily in sunny weather for additional sun protection. For maximum protection on the sunniest days, you can take up to 2 capsules of regular Heliocare OR take 1 capsule of Heliocare Ultra. For prolonged exposure (such as a full day in the sun), you can  repeat your dose of the supplement 4 hours after your first dose. Heliocare can be purchased at [facility] or at [email or web address].     If your medication is too expensive, please contact our office at [phone] option 4 or send us a message through MyChart.   We are unable to tell what your co-pay for medications will be in advance as this is different depending on your insurance coverage. However, we may be able to find a substitute medication at lower cost or fill out paperwork to get insurance to cover a needed medication.   If a prior authorization is required to get your medication covered by your insurance company, please allow us 1-2 business days to complete this process.  Drug prices often vary depending on where the prescription is filled and some pharmacies may offer cheaper prices.  The website www.goodrx.com contains coupons for medications through different pharmacies. The prices here do not account for what the cost may be with help from insurance (it may be cheaper with your insurance), but the website can give you the price if you did not use any insurance.  - You can print the associated coupon and take it with your prescription to the pharmacy.  - You may also stop by our office during regular business hours and pick up a GoodRx coupon card.  - If you need your prescription sent electronically to a different pharmacy, notify our office through [place] MyChart or by phone at   [phone] option 4.  

## 2021-05-15 NOTE — Progress Notes (Signed)
   Follow-Up Visit   Subjective  Carolyn Thomas is a 55 y.o. female who presents for the following: patch test follow up (Patient is here today for follow up on 7 day patch test. Patient states she was told that they did not notice any allergies. She doesn't have card with her today. Patient reports rash under eyes is gone. ).  Patient reports she has been using helliocare to help with sun sensitivity   The following portions of the chart were reviewed this encounter and updated as appropriate:  Tobacco  Allergies  Meds  Problems  Med Hx  Surg Hx  Fam Hx       Objective  Well appearing patient in no apparent distress; mood and affect are within normal limits.  A focused examination was performed including eyelids and back. Relevant physical exam findings are noted in the Assessment and Plan.  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 Currently well-controlled.  No reaction to patch testing although some patches folded together and were not adequately tested (see last note).   Clear today.   Continue heliocare ultra daily or heliocare regular 2 tablets daily, can repeat mid-day if spending a full day in the sun. Continue daily SPF 50+, reapply every 2 hours as needed.  If recurs, restart HC 2.5% ointment twice daily for up to 1 week to eyelids. If needing often, will need to switch back to elidel.  Avoid rubbing eyes. Avoid eye makeup when possible.   If rash at arms and neck recur, restart TMC 0.1% cream twice daily for up to 2 weeks to areas at arms and neck. Avoid applying to face, groin, and axilla. Use as directed. Risk of skin atrophy with long-term use reviewed.   Recommend plain Vaseline to lips. Can also use hydrocortisone there twice a day up to a week if needed and can use Elidel in that  area long-term if needed.   Topical steroids (such as triamcinolone, fluocinolone, fluocinonide, mometasone, clobetasol, halobetasol, betamethasone, hydrocortisone) can cause thinning and lightening of the skin if they are used for too long in the same area. Your physician has selected the right strength medicine for your problem and area affected on the body. Please use your medication only as directed by your physician to prevent side effects.   If rash recurs and is persistent may consider referring to [facility]  for additional patch testing.   Seborrheic Keratoses - Stuck-on, waxy, tan-brown papules and/or plaques at right dorsal foot  - Benign-appearing - Discussed benign etiology and prognosis. - Observe - Call for any changes  Return if symptoms worsen or fail to improve. I, Asher Muir, CMA, am acting as scribe for Darden Dates, MD.  Documentation: I have reviewed the above documentation for accuracy and completeness, and I agree with the above.  Darden Dates, MD

## 2021-05-24 DIAGNOSIS — F4312 Post-traumatic stress disorder, chronic: Secondary | ICD-10-CM | POA: Diagnosis not present

## 2021-05-28 ENCOUNTER — Encounter: Payer: Self-pay | Admitting: Dermatology

## 2021-05-30 DIAGNOSIS — F4312 Post-traumatic stress disorder, chronic: Secondary | ICD-10-CM | POA: Diagnosis not present

## 2021-07-11 ENCOUNTER — Ambulatory Visit: Payer: BC Managed Care – PPO | Admitting: Dermatology

## 2021-07-18 DIAGNOSIS — F4312 Post-traumatic stress disorder, chronic: Secondary | ICD-10-CM | POA: Diagnosis not present

## 2021-07-25 DIAGNOSIS — F4312 Post-traumatic stress disorder, chronic: Secondary | ICD-10-CM | POA: Diagnosis not present

## 2021-08-13 DIAGNOSIS — F4312 Post-traumatic stress disorder, chronic: Secondary | ICD-10-CM | POA: Diagnosis not present

## 2021-08-20 DIAGNOSIS — F4312 Post-traumatic stress disorder, chronic: Secondary | ICD-10-CM | POA: Diagnosis not present

## 2021-10-10 ENCOUNTER — Ambulatory Visit (INDEPENDENT_AMBULATORY_CARE_PROVIDER_SITE_OTHER): Payer: BC Managed Care – PPO | Admitting: Dermatology

## 2021-10-10 ENCOUNTER — Other Ambulatory Visit: Payer: Self-pay

## 2021-10-10 DIAGNOSIS — L811 Chloasma: Secondary | ICD-10-CM | POA: Diagnosis not present

## 2021-10-10 DIAGNOSIS — L988 Other specified disorders of the skin and subcutaneous tissue: Secondary | ICD-10-CM

## 2021-10-10 NOTE — Progress Notes (Signed)
   Follow-Up Visit   Subjective  Carolyn Thomas is a 55 y.o. female who presents for the following: Facial Elastosis (Face, pt presents for botox today, last txt 25m ago in [place]). She would also like discussed treatment for her melasma.  She has used some hydroquinone products in the past.  The following portions of the chart were reviewed this encounter and updated as appropriate:   Tobacco  Allergies  Meds  Problems  Med Hx  Surg Hx  Fam Hx     Review of Systems:  No other skin or systemic complaints except as noted in HPI or Assessment and Plan.  Objective  Well appearing patient in no apparent distress; mood and affect are within normal limits.  A focused examination was performed including face. Relevant physical exam findings are noted in the Assessment and Plan.  face Rhytides and volume loss.                    face Reticulated hyperpigmented patches.         Assessment & Plan  Elastosis of skin face  Botox 30 units injected today to: - Frown complex 15 units - Crow's feet 7.5 units each side for total of 15 units   Intralesional injection - face Location: frown complex, crow's feet,   Informed consent: Discussed risks (infection, pain, bleeding, bruising, swelling, allergic reaction, paralysis of nearby muscles, eyelid droop, double vision, neck weakness, difficulty breathing, headache, undesirable cosmetic result, and need for additional treatment) and benefits of the procedure, as well as the alternatives.  Informed consent was obtained.  Preparation: The area was cleansed with alcohol.  Procedure Details:  Botox was injected into the dermis with a 30-gauge needle. Pressure applied to any bleeding. Ice packs offered for swelling.  Lot Number:  [id number] Expiration:  06/2023  Total Units Injected:  30  Plan: Patient was instructed to remain upright for 4 hours. Patient was instructed to avoid massaging the face and avoid vigorous  exercise for the rest of the day. Tylenol may be used for headache.  Allow 2 weeks before returning to clinic for additional dosing as needed. Patient will call for any problems.   Melasma face  Melasma is a chronic condition of persistent pigmented patches generally on the face, worse in summer due to higher UV exposure.  Oral estrogen containing BCPs or supplements can exacerbate condition.  Recommend daily broad spectrum tinted sunscreen SPF 30+ to face, preferably with Zinc or Titanium Dioxide. Discussed Rx topical bleaching creams (i.e. hydroquinone), OTC HelioCare supplement, chemical peels (would need multiple for best result).   Start prescription [facility] Hydroquinone 8%, Tretinoin 0.025%, Kojic acid 1%, Niacinamide 4%, Fluocinolone 0.025% cream, a pea sized amount nightly to dark spots on face for up to 3 months. This cannot be used more than 3 months due to risk of skin atrophy (thinning) and exogenous ochronosis (permanent dark spots). The patient was advised this is not covered by insurance. They will receive an email to check out and the medication will be mailed to their home.   Return in about 4 weeks (around 11/07/2021) for 4-6wks for Botox f/u.  I, Ardis Rowan, RMA, am acting as scribe for [name], MD . Documentation: I have reviewed the above documentation for accuracy and completeness, and I agree with the above.  [name], MD

## 2021-10-10 NOTE — Patient Instructions (Addendum)
If You Need Anything After Your Visit ° °If you have any questions or concerns for your doctor, please call our main line at [phone] and press option 4 to reach your doctor's medical assistant. If no one answers, please leave a voicemail as directed and we will return your call as soon as possible. Messages left after 4 pm will be answered the following business day.  ° °You may also send us a message via MyChart. We typically respond to MyChart messages within 1-2 business days. ° °For prescription refills, please ask your pharmacy to contact our office. Our fax number is [phone]. ° °If you have an urgent issue when the clinic is closed that cannot wait until the next business day, you can page your doctor at the number below.   ° °Please note that while we do our best to be available for urgent issues outside of office hours, we are not available 24/7.  ° °If you have an urgent issue and are unable to reach us, you may choose to seek medical care at your doctor's office, retail clinic, urgent care center, or emergency room. ° °If you have a medical emergency, please immediately call 911 or go to the emergency department. ° °Pager Numbers ° °- Dr. Kowalski: [phone] ° °- Dr. Moye: [phone] ° °- Dr. Stewart: [phone] ° °In the event of inclement weather, please call our main line at [phone] for an update on the status of any delays or closures. ° °Dermatology Medication Tips: °Please keep the boxes that topical medications come in in order to help keep track of the instructions about where and how to use these. Pharmacies typically print the medication instructions only on the boxes and not directly on the medication tubes.  ° °If your medication is too expensive, please contact our office at [phone] option 4 or send us a message through MyChart.  ° °We are unable to tell what your co-pay for medications will be in advance as this is different depending on your insurance coverage.  However, we may be able to find a substitute medication at lower cost or fill out paperwork to get insurance to cover a needed medication.  ° °If a prior authorization is required to get your medication covered by your insurance company, please allow us 1-2 business days to complete this process. ° °Drug prices often vary depending on where the prescription is filled and some pharmacies may offer cheaper prices. ° °The website www.goodrx.com contains coupons for medications through different pharmacies. The prices here do not account for what the cost may be with help from insurance (it may be cheaper with your insurance), but the website can give you the price if you did not use any insurance.  °- You can print the associated coupon and take it with your prescription to the pharmacy.  °- You may also stop by our office during regular business hours and pick up a GoodRx coupon card.  °- If you need your prescription sent electronically to a different pharmacy, notify our office through [place] MyChart or by phone at [phone] option 4. ° ° ° ° °Si Usted Necesita Algo Después de Su Visita ° °También puede enviarnos un mensaje a través de MyChart. Por lo general respondemos a los mensajes de MyChart en el transcurso de 1 a 2 días hábiles. ° °Para renovar recetas, por favor pida a su farmacia que se ponga en contacto con nuestra oficina. Nuestro número de fax es el [phone]. ° °Si tiene   un asunto urgente cuando la clínica esté cerrada y que no puede esperar hasta el siguiente día hábil, puede llamar/localizar a su doctor(a) al número que aparece a continuación.  ° °Por favor, tenga en cuenta que aunque hacemos todo lo posible para estar disponibles para asuntos urgentes fuera del horario de oficina, no estamos disponibles las 24 horas del día, los 7 días de la semana.  ° °Si tiene un problema urgente y no puede comunicarse con nosotros, puede optar por buscar atención médica  en el consultorio de su  doctor(a), en una clínica privada, en un centro de atención urgente o en una sala de emergencias. ° °Si tiene una emergencia médica, por favor llame inmediatamente al 911 o vaya a la sala de emergencias. ° °Números de bíper ° °- Dr. Kowalski: [phone] ° °- Dra. Moye: [phone] ° °- Dra. Stewart: [phone] ° °En caso de inclemencias del tiempo, por favor llame a nuestra línea principal al [phone] para una actualización sobre el estado de cualquier retraso o cierre. ° °Consejos para la medicación en dermatología: °Por favor, guarde las cajas en las que vienen los medicamentos de uso tópico para ayudarle a seguir las instrucciones sobre dónde y cómo usarlos. Las farmacias generalmente imprimen las instrucciones del medicamento sólo en las cajas y no directamente en los tubos del medicamento.  ° °Si su medicamento es muy caro, por favor, póngase en contacto con nuestra oficina llamando al [phone] y presione la opción 4 o envíenos un mensaje a través de MyChart.  ° °No podemos decirle cuál será su copago por los medicamentos por adelantado ya que esto es diferente dependiendo de la cobertura de su seguro. Sin embargo, es posible que podamos encontrar un medicamento sustituto a menor costo o llenar un formulario para que el seguro cubra el medicamento que se considera necesario.  ° °Si se requiere una autorización previa para que su compañía de seguros cubra su medicamento, por favor permítanos de 1 a 2 días hábiles para completar este proceso. ° °Los precios de los medicamentos varían con frecuencia dependiendo del lugar de dónde se surte la receta y alguna farmacias pueden ofrecer precios más baratos. ° °El sitio web www.goodrx.com tiene cupones para medicamentos de diferentes farmacias. Los precios aquí no tienen en cuenta lo que podría costar con la ayuda del seguro (puede ser más barato con su seguro), pero el sitio web puede darle el precio si no utilizó ningún seguro.  °- Puede imprimir el cupón  correspondiente y llevarlo con su receta a la farmacia.  °- También puede pasar por nuestra oficina durante el horario de atención regular y recoger una tarjeta de cupones de GoodRx.  °- Si necesita que su receta se envíe electrónicamente a una farmacia diferente, informe a nuestra oficina a través de MyChart de [place] o por teléfono llamando al [phone] y presione la opción 4. ° °Instructions for [facility] Medications ° °One or more of your medications was sent to the [facility] mail order compounding pharmacy. You will receive an email from them and can purchase the medicine through that link. It will then be mailed to your home at the address you confirmed. If for any reason you do not receive an email from them, please check your spam folder. If you still do not find the email, please let us know. [facility] phone number is [phone].  ° °

## 2021-10-15 ENCOUNTER — Ambulatory Visit: Payer: BC Managed Care – PPO | Admitting: Obstetrics and Gynecology

## 2021-10-15 ENCOUNTER — Other Ambulatory Visit (HOSPITAL_COMMUNITY)
Admission: RE | Admit: 2021-10-15 | Discharge: 2021-10-15 | Disposition: A | Discharge status: Home / Self Care | Payer: BC Managed Care – PPO | Source: Ambulatory Visit | Attending: Obstetrics and Gynecology | Admitting: Obstetrics and Gynecology

## 2021-10-15 ENCOUNTER — Encounter: Payer: Self-pay | Admitting: Obstetrics and Gynecology

## 2021-10-15 ENCOUNTER — Other Ambulatory Visit: Payer: Self-pay

## 2021-10-15 VITALS — BP 118/70 | Ht 64.0 in | Wt 147.0 lb

## 2021-10-15 DIAGNOSIS — F32A Depression, unspecified: Secondary | ICD-10-CM

## 2021-10-15 DIAGNOSIS — R102 Pelvic and perineal pain: Secondary | ICD-10-CM | POA: Diagnosis not present

## 2021-10-15 DIAGNOSIS — F419 Anxiety disorder, unspecified: Secondary | ICD-10-CM | POA: Diagnosis not present

## 2021-10-15 DIAGNOSIS — Z1231 Encounter for screening mammogram for malignant neoplasm of breast: Secondary | ICD-10-CM

## 2021-10-15 DIAGNOSIS — N952 Postmenopausal atrophic vaginitis: Secondary | ICD-10-CM

## 2021-10-15 DIAGNOSIS — Z124 Encounter for screening for malignant neoplasm of cervix: Secondary | ICD-10-CM

## 2021-10-15 DIAGNOSIS — N941 Unspecified dyspareunia: Secondary | ICD-10-CM

## 2021-10-15 DIAGNOSIS — R232 Flushing: Secondary | ICD-10-CM

## 2021-10-15 MED ORDER — ESTRADIOL 0.1 MG/GM VA CREA: 2.0000 g | TOPICAL_CREAM | Freq: Every day | VAGINAL | 2 refills | Status: DC

## 2021-10-15 NOTE — Patient Instructions (Signed)
Exercising to Stay Healthy To become healthy and stay healthy, it is recommended that you do moderate-intensity and vigorous-intensity exercise. You can tell that you are exercising at a moderate intensity if your heart starts beating faster and you start breathing faster but can still hold a conversation. You can tell that you are exercising at a vigorous intensity if you are breathing much harder and faster and cannot hold a conversation while exercising. How can exercise benefit me? Exercising regularly is important. It has many health benefits, such as: Improving overall fitness, flexibility, and endurance. Increasing bone density. Helping with weight control. Decreasing body fat. Increasing muscle strength and endurance. Reducing stress and tension, anxiety, depression, or anger. Improving overall health. What guidelines should I follow while exercising? Before you start a new exercise program, talk with your health care provider. Do not exercise so much that you hurt yourself, feel dizzy, or get very short of breath. Wear comfortable clothes and wear shoes with good support. Drink plenty of water while you exercise to prevent dehydration or heat stroke. Work out until your breathing and your heartbeat get faster (moderate intensity). How often should I exercise? Choose an activity that you enjoy, and set realistic goals. Your health care provider can help you make an activity plan that is individually designed and works best for you. Exercise regularly as told by your health care provider. This may include: Doing strength training two times a week, such as: Lifting weights. Using resistance bands. Push-ups. Sit-ups. Yoga. Doing a certain intensity of exercise for a given amount of time. Choose from these options: A total of 150 minutes of moderate-intensity exercise every week. A total of 75 minutes of vigorous-intensity exercise every week. A mix of moderate-intensity and  vigorous-intensity exercise every week. Children, pregnant women, people who have not exercised regularly, people who are overweight, and older adults may need to talk with a health care provider about what activities are safe to perform. If you have a medical condition, be sure to talk with your health care provider before you start a new exercise program. What are some exercise ideas? Moderate-intensity exercise ideas include: Walking 1 mile (1.6 km) in about 15 minutes. Biking. Hiking. Golfing. Dancing. Water aerobics. Vigorous-intensity exercise ideas include: Walking 4.5 miles (7.2 km) or more in about 1 hour. Jogging or running 5 miles (8 km) in about 1 hour. Biking 10 miles (16.1 km) or more in about 1 hour. Lap swimming. Roller-skating or in-line skating. Cross-country skiing. Vigorous competitive sports, such as football, basketball, and soccer. Jumping rope. Aerobic dancing. What are some everyday activities that can help me get exercise? Yard work, such as: Child psychotherapist. Raking and bagging leaves. Washing your car. Pushing a stroller. Shoveling snow. Gardening. Washing windows or floors. How can I be more active in my day-to-day activities? Use stairs instead of an elevator. Take a walk during your lunch break. If you drive, park your car farther away from your work or school. If you take public transportation, get off one stop early and walk the rest of the way. Stand up or walk around during all of your indoor phone calls. Get up, stretch, and walk around every 30 minutes throughout the day. Enjoy exercise with a friend. Support to continue exercising will help you keep a regular routine of activity. Where to find more information You can find more information about exercising to stay healthy from: U.S. Department of Health and Human Services: ThisPath.fi Centers for  Disease Control and Prevention (CDC): FootballExhibition.com.br Summary Exercising regularly is  important. It will improve your overall fitness, flexibility, and endurance. Regular exercise will also improve your overall health. It can help you control your weight, reduce stress, and improve your bone density. Do not exercise so much that you hurt yourself, feel dizzy, or get very short of breath. Before you start a new exercise program, talk with your health care provider. This information is not intended to replace advice given to you by your health care provider. Make sure you discuss any questions you have with your health care provider. Document Revised: 02/23/2021 Document Reviewed: 02/23/2021 Elsevier Patient Education  2022 Elsevier Inc. Budget-Friendly Healthy Eating There are many ways to save money at the grocery store and continue to eat healthy. You can be successful if you: Plan meals according to your budget. Make a grocery list and only purchase food according to your grocery list. Prepare food yourself at home. What are tips for following this plan? Reading food labels Compare food labels between brand name foods and the store brand. Often the nutritional value is the same, but the store brand is lower cost. Look for products that do not have added sugar, fat, or salt (sodium). These often cost the same but are healthier for you. Products may be labeled as: Sugar-free. Nonfat. Low-fat. Sodium-free. Low-sodium. Look for lean ground beef labeled as at least 92% lean and 8% fat. Shopping  Buy only the items on your grocery list and go only to the areas of the store that have the items on your list. Use coupons only for foods and brands you normally buy. Avoid buying items you wouldn't normally buy simply because they are on sale. Check online and in newspapers for weekly deals. Buy healthy items from the bulk bins when available, such as herbs, spices, flour, pasta, nuts, and dried fruit. Buy fruits and vegetables that are in season. Prices are usually lower on  in-season produce. Look at the unit price on the price tag. Use it to compare different brands and sizes to find out which item is the best deal. Choose healthy items that are often low-cost, such as carrots, potatoes, apples, bananas, and oranges. Dried or canned beans are a low-cost protein source. Buy in bulk and freeze extra food. Items you can buy in bulk include meats, fish, poultry, frozen fruits, and frozen vegetables. Avoid buying "ready-to-eat" foods, such as pre-cut fruits and vegetables and pre-made salads. If possible, shop around to discover where you can find the best prices. Consider other retailers such as dollar stores, larger [employer], local fruit and vegetable stands, and farmers markets. Do not shop when you are hungry. If you shop while hungry, it may be hard to stick to your list and budget. Resist impulse buying. Use your grocery list as your official plan for the week. Buy a variety of vegetables and fruits by purchasing fresh, frozen, and canned items. Look at the top and bottom shelves for deals. Foods at eye level (eye level of an adult or child) are usually more expensive. Be efficient with your time when shopping. The more time you spend at the store, the more money you are likely to spend. To save money when choosing more expensive foods like meats and dairy: Choose cheaper cuts of meat, such as bone-in chicken thighs and drumsticks instead of skinless and boneless chicken. When you are ready to prepare the chicken, you can remove the skin yourself to make it healthier.  Choose lean meats like chicken or [place] instead of beef. Choose canned seafood, such as tuna, salmon, or sardines. Buy eggs as a low-cost source of protein. Buy dried beans and peas, such as lentils, split peas, or kidney beans instead of meats. Dried beans and peas are a good alternative source of protein. Buy the larger tubs of yogurt instead of individual-sized containers. Choose water  instead of sodas and other sweetened beverages. Avoid buying chips, cookies, and other "junk food." These items are usually expensive and not healthy. Cooking Make extra food and freeze the extras in meal-sized containers or in individual portions for fast meals and snacks. Pre-cook on days when you have extra time to prepare meals in advance. You can keep these meals in the fridge or freezer and reheat for a quick meal. When you come home from the grocery store, wash, peel, and cut fruits and vegetables so they are ready to use and eat. This will help reduce food waste. Meal planning Do not eat out or get fast food. Prepare food at home. Make a grocery list and make sure to bring it with you to the store. If you have a smart phone, you could use your phone to create your shopping list. Plan meals and snacks according to a grocery list and budget you create. Use leftovers in your meal plan for the week. Look for recipes where you can cook once and make enough food for two meals. Prepare budget-friendly types of meals like stews, casseroles, and stir-fry dishes. Try some meatless meals or try "no cook" meals like salads. Make sure that half your plate is filled with fruits or vegetables. Choose from fresh, frozen, or canned fruits and vegetables. If eating canned, remember to rinse them before eating. This will remove any excess salt added for packaging. Summary Eating healthy on a budget is possible if you plan your meals according to your budget, purchase according to your budget and grocery list, and prepare food yourself. Tips for buying more food on a limited budget include buying generic brands, using coupons only for foods you normally buy, and buying healthy items from the bulk bins when available. Tips for buying cheaper food to replace expensive food include choosing cheaper, lean cuts of meat, and buying dried beans and peas. This information is not intended to replace advice given to  you by your health care provider. Make sure you discuss any questions you have with your health care provider. Document Revised: 08/10/2020 Document Reviewed: 08/10/2020 Elsevier Patient Education  2022 Elsevier Inc. Bone Health Bones protect organs, store calcium, anchor muscles, and support the whole body. Keeping your bones strong is important, especially as you get older. You can take actions to help keep your bones strong and healthy. Why is keeping my bones healthy important? Keeping your bones healthy is important because your body constantly replaces bone cells. Cells get old, and new cells take their place. As we age, we lose bone cells because the body may not be able to make enough new cells to replace the old cells. The amount of bone cells and bone tissue you have is referred to as bone mass. The higher your bone mass, the stronger your bones. The aging process leads to an overall loss of bone mass in the body, which can increase the likelihood of: Broken bones. A condition in which the bones become weak and brittle (osteoporosis). A large decline in bone mass occurs in older adults. In women, it occurs about the  time of menopause. What actions can I take to keep my bones healthy? Good health habits are important for maintaining healthy bones. This includes eating nutritious foods and exercising regularly. To have healthy bones, you need to get enough of the right minerals and vitamins. Most nutrition experts recommend getting these nutrients from the foods that you eat. In some cases, taking supplements may also be recommended. Doing certain types of exercise is also important for bone health. What are the nutritional recommendations for healthy bones? Eating a well-balanced diet with plenty of calcium and vitamin D will help to protect your bones. Nutritional recommendations vary from person to person. Ask your health care provider what is healthy for you. Here are some general  guidelines. Get enough calcium Calcium is the most important (essential) mineral for bone health. Most people can get enough calcium from their diet, but supplements may be recommended for people who are at risk for osteoporosis. Good sources of calcium include: Dairy products, such as low-fat or nonfat milk, cheese, and yogurt. Dark green leafy vegetables, such as bok choy and broccoli. Foods that have calcium added to them (are fortified). Foods that may be fortified with calcium include orange juice, cereal, bread, soy beverages, and tofu products. Nuts, such as almonds. Follow these recommended amounts for daily calcium intake: Infants, 0-6 months: 200 mg. Infants, 6-12 months: 260 mg. Children, age 50-3: 700 mg. Children, age 55-8: 1,000 mg. Children, age 71-13: 1,300 mg. Teens, age 557-18: 1,300 mg. Adults, age 72-50: 1,000 mg. Adults, age 55-70: Men: 1,000 mg. Women: 1,200 mg. Adults, age 22 or older: 1,200 mg. Pregnant and breastfeeding females: Teens: 1,300 mg. Adults: 1,000 mg. Get enough vitamin D Vitamin D is the most essential vitamin for bone health. It helps the body absorb calcium. Sunlight stimulates the skin to make vitamin D, so be sure to get enough sunlight. If you live in a cold climate or you do not get outside often, your health care provider may recommend that you take vitamin D supplements. Good sources of vitamin D in your diet include: Egg yolks. Saltwater fish. Milk and cereal fortified with vitamin D. Follow these recommended amounts for daily vitamin D intake: Infants, 0-12 months: 400 international units (IU). Children and teens, age 50-18: 600 international units. Adults, age 61 or younger: 600 international units. Adults, age 57 or older: 600-1,000 international units. Get other important nutrients Other nutrients that are important for bone health include: Phosphorus. This mineral is found in meat, poultry, dairy foods, nuts, and legumes. The  recommended daily intake for adult men and adult women is 700 mg. Magnesium. This mineral is found in seeds, nuts, dark green vegetables, and legumes. The recommended daily intake for adult men is 400-420 mg. For adult women, it is 310-320 mg. Vitamin K. This vitamin is found in green leafy vegetables. The recommended daily intake is 120 mcg for adult men and 90 mcg for adult women. What type of physical activity is best for building and maintaining healthy bones? Weight-bearing and strength-building activities are important for building and maintaining healthy bones. Weight-bearing activities cause muscles and bones to work against gravity. Strength-building activities increase the strength of the muscles that support bones. Weight-bearing and muscle-building activities include: Walking and hiking. Jogging and running. Dancing. Gym exercises. Lifting weights. Tennis and racquetball. Climbing stairs. Aerobics. Adults should get at least 30 minutes of moderate physical activity on most days. Children should get at least 60 minutes of moderate physical activity on most days. Ask  your health care provider what type of exercise is best for you. How can I find out if my bone mass is low? Bone mass can be measured with an X-ray test called a bone mineral density (BMD) test. This test is recommended for all women who are age 77 or older. It may also be recommended for: Men who are age 10 or older. People who are at risk for osteoporosis because of: Having a long-term disease that weakens bones, such as kidney disease or rheumatoid arthritis. Having menopause earlier than normal. Taking medicine that weakens bones, such as steroids, thyroid hormones, or hormone treatment for breast cancer or prostate cancer. Smoking. Drinking three or more alcoholic drinks a day. Being underweight. Sedentary lifestyle. If you find that you have a low bone mass, you may be able to prevent osteoporosis or further bone  loss by changing your diet and lifestyle. Where can I find more information? Bone Health & Osteoporosis Foundation: https://carlson-fletcher.info/ [employer]: www.bones.http://www.myers.net/ International Osteoporosis Foundation: Investment banker, operational.iofbonehealth.org Summary The aging process leads to an overall loss of bone mass in the body, which can increase the likelihood of broken bones and osteoporosis. Eating a well-balanced diet with plenty of calcium and vitamin D will help to protect your bones. Weight-bearing and strength-building activities are also important for building and maintaining strong bones. Bone mass can be measured with an X-ray test called a bone mineral density (BMD) test. This information is not intended to replace advice given to you by your health care provider. Make sure you discuss any questions you have with your health care provider. Document Revised: 04/11/2021 Document Reviewed: 04/11/2021 Elsevier Patient Education  2022 [employer].

## 2021-10-15 NOTE — Progress Notes (Signed)
Gynecology Annual Exam  PCP: Sherron Monday, MD  Chief Complaint:  Chief Complaint  Patient presents with   Gynecologic Exam    Patient is having pain during intercourse.     History of Present Illness: Patient is a 55 y.o. G3P3003 presents for annual exam. She has additional concerns regarding pain with intercourse and vaginal dryness. She denies itching. She denies discharge. She has not tried prior treatments.  LMP: No LMP recorded. (Menstrual status: Perimenopausal). She denies postmenopausal bleeding or spotting  The patient is sexually active. She has dyspareunia.  Postcoital Bleeding: yes   The patient does perform self breast exams.  There is no notable family history of breast or ovarian cancer in her family.  The patient has regular exercise: yes, crossfit  The patient reports current symptoms of depression. She prefers management without medication  PHQ-9: 14 GAD-7: 15   Review of Systems: Review of Systems  Constitutional:  Negative for chills, fever, malaise/fatigue and weight loss.  HENT:  Negative for congestion, hearing loss and sinus pain.   Eyes:  Negative for blurred vision and double vision.  Respiratory:  Negative for cough, sputum production, shortness of breath and wheezing.   Cardiovascular:  Negative for chest pain, palpitations, orthopnea and leg swelling.  Gastrointestinal:  Negative for abdominal pain, constipation, diarrhea, nausea and vomiting.  Genitourinary:  Negative for dysuria, flank pain, frequency, hematuria and urgency.  Musculoskeletal:  Negative for back pain, falls and joint pain.  Skin:  Negative for itching and rash.  Neurological:  Negative for dizziness and headaches.  Psychiatric/Behavioral:  Negative for depression, substance abuse and suicidal ideas. The patient is not nervous/anxious.    Past Medical History:  Past Medical History:  Diagnosis Date   Anxiety    Fibromyalgia 10/2016   Migraine    Stomach ulcer      Past Surgical History:  Past Surgical History:  Procedure Laterality Date   CESAREAN SECTION     x2   TUBAL LIGATION      Gynecologic History:  No LMP recorded. (Menstrual status: Perimenopausal). Last Pap: Results were: 2016  Last mammogram: 2016  Obstetric History: G3P3003  Family History:  Family History  Problem Relation Age of Onset   Heart disease Father        age 55    Social History:  Social History   Socioeconomic History   Marital status: Married    Spouse name: Not on file   Number of children: Not on file   Years of education: Not on file   Highest education level: Not on file  Occupational History   Not on file  Tobacco Use   Smoking status: Never   Smokeless tobacco: Never  Vaping Use   Vaping Use: Never used  Substance and Sexual Activity   Alcohol use: No   Drug use: No   Sexual activity: Yes    Birth control/protection: Surgical    Comment: Tubal ligation   Other Topics Concern   Not on file  Social History Narrative   Lives in [place]. Works out regularly. Eats healthy diet. 3 children, 2 living at home. Has cat. Assists in office.   Social Determinants of Health   Financial Resource Strain: Not on file  Food Insecurity: Not on file  Transportation Needs: Not on file  Physical Activity: Not on file  Stress: Not on file  Social Connections: Not on file  Intimate Partner Violence: Not on file    Allergies:  No  Known Allergies  Medications: Prior to Admission medications   Medication Sig Start Date End Date Taking? Authorizing Provider  clonazePAM (KLONOPIN) 0.5 MG tablet TAKE 1 TABLET BY MOUTH AT BEDTIME Patient not taking: Reported on 10/15/2021 12/24/16   Tommie Sams, DO  conjugated estrogens (PREMARIN) vaginal cream Place 1 Applicatorful vaginally daily. Patient not taking: Reported on 07/23/2018 08/04/17   Tommie Sams, DO  DULoxetine (CYMBALTA) 30 MG capsule TAKE 1 CAPSULE (30 MG TOTAL) BY MOUTH DAILY. Patient not  taking: Reported on 10/15/2021 02/10/17   Sherlene Shams, MD  hydrocortisone 2.5 % ointment Twice daily to eyelids for up to 1 week. Patient not taking: Reported on 10/15/2021 05/08/21   Neale Burly, [place], MD  Multiple Vitamin (MULTIVITAMIN) capsule Take 1 capsule by mouth daily.   Patient not taking: Reported on 10/15/2021    [provider]  mupirocin ointment (BACTROBAN) 2 % Apply to aa ear TID until healed. Patient not taking: Reported on 10/15/2021 04/12/21   Neale Burly, [place], MD  pimecrolimus (ELIDEL) 1 % cream Apply topically 2 (two) times daily. Apply to rash BID PRN. Patient not taking: Reported on 10/15/2021 04/12/21   Neale Burly, [place], MD  tretinoin (RETIN-A) 0.1 % cream Apply topically at bedtime. Patient not taking: Reported on 10/15/2021 07/29/16   Tommie Sams, DO  triamcinolone cream (KENALOG) 0.1 % Apply 1 application topically 2 (two) times daily as needed. To affected areas at arms for up to 2 weeks as needed for rash. Avoid applying to face, groin, and axilla. Use as directed. Risk of skin atrophy with long-term use reviewed. Patient not taking: Reported on 10/15/2021 05/08/21   Neale Burly, [place], MD  triamcinolone ointment (KENALOG) 0.1 % Apply 1 application topically 2 (two) times daily. Patient not taking: Reported on 12/17/2018 07/29/16   Tommie Sams, DO    Physical Exam Vitals: Blood pressure 118/70, height 5\' 4"  (1.626 m), weight 147 lb (66.7 kg).  Physical Exam Constitutional:      Appearance: She is well-developed.  Genitourinary:     Genitourinary Comments: External: Normal appearing vulva. No lesions noted.  Speculum examination: Normal appearing cervix. No blood in the vaginal vault. No discharge. Bimanual examination: Uterus midline, non-tender, normal in size, shape and contour.  No CMT. No adnexal masses. No adnexal tenderness. Pelvis not fixed.  Breast Exam: breast equal without skin changes, nipple discharge, breast lump or enlarged lymph nodes   HENT:     Head:  Normocephalic and atraumatic.  Neck:     Thyroid: No thyromegaly.  Cardiovascular:     Rate and Rhythm: Normal rate and regular rhythm.     Heart sounds: Normal heart sounds.  Pulmonary:     Effort: Pulmonary effort is normal.     Breath sounds: Normal breath sounds.  Abdominal:     General: Bowel sounds are normal. There is no distension.     Palpations: Abdomen is soft. There is no mass.  Musculoskeletal:     Cervical back: Neck supple.  Neurological:     Mental Status: She is alert and oriented to person, place, and time.  Skin:    General: Skin is warm and dry.  Psychiatric:        Behavior: Behavior normal.        Thought Content: Thought content normal.        Judgment: Judgment normal.  Vitals reviewed.     Female chaperone present for pelvic and breast  portions of the physical exam  Assessment: 55  y.o. [id number] routine annual exam  Plan: Problem List Items Addressed This Visit   None Visit Diagnoses     Cervical cancer screening    -  Primary   Relevant Orders   Cytology - PAP   Breast cancer screening by mammogram       Relevant Orders   MM 3D SCREEN BREAST BILATERAL       1) Mammogram - recommend yearly screening mammogram.  Mammogram Was ordered today  2) STI screening was offered and declined  3) Pap smear today  4) Colonoscopy -- 2018  5) Routine healthcare maintenance including cholesterol, diabetes screening discussed managed by PCP  6) Osteoporosis screening - no increased risk factor  7) Depression and anxiety- referral to psychiatry  8) Vaginal atrophy- topical estrace and pelvic PT referral  Adrian Prows MD, [place], [facility] 10/15/2021 12:06 PM

## 2021-10-17 ENCOUNTER — Encounter: Payer: Self-pay | Admitting: Dermatology

## 2021-10-22 LAB — CYTOLOGY - PAP
Comment: NEGATIVE
Diagnosis: NEGATIVE
Diagnosis: REACTIVE
High risk HPV: NEGATIVE

## 2021-11-13 ENCOUNTER — Ambulatory Visit: Payer: BC Managed Care – PPO | Admitting: Dermatology

## 2022-04-16 ENCOUNTER — Other Ambulatory Visit: Payer: Self-pay | Admitting: Dermatology

## 2022-04-16 DIAGNOSIS — L309 Dermatitis, unspecified: Secondary | ICD-10-CM

## 2022-04-17 ENCOUNTER — Ambulatory Visit: Payer: BC Managed Care – PPO | Admitting: Dermatology

## 2022-04-17 DIAGNOSIS — H01135 Eczematous dermatitis of left lower eyelid: Secondary | ICD-10-CM

## 2022-04-17 DIAGNOSIS — H01132 Eczematous dermatitis of right lower eyelid: Secondary | ICD-10-CM

## 2022-04-17 DIAGNOSIS — H01131 Eczematous dermatitis of right upper eyelid: Secondary | ICD-10-CM

## 2022-04-17 DIAGNOSIS — H01134 Eczematous dermatitis of left upper eyelid: Secondary | ICD-10-CM

## 2022-04-17 NOTE — Progress Notes (Signed)
   Follow-Up Visit   Subjective  Carolyn Thomas is a 56 y.o. female who presents for the following: rash (Around eyes, ~3wks, swelling, itching, pt used some mupirocin and another cream she is not sure what other cream was, also taking heliocare, no hx of allergies, no hx of eczema, but this also happened as a child). She gets itchy skin rash with sun exposure on her arms and chest.  Had patch test last year for similar rash which was negative.   The following portions of the chart were reviewed this encounter and updated as appropriate:       Review of Systems:  No other skin or systemic complaints except as noted in HPI or Assessment and Plan.  Objective  Well appearing patient in no apparent distress; mood and affect are within normal limits.  A focused examination was performed including face. Relevant physical exam findings are noted in the Assessment and Plan.  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 True Patch Test 36 05/2021 with no reaction  Start HC 2.5% oint bid to eyelids for up to 1 week to calm down itching and redness  Once eyelids calmed down start Elidel cream bid until eyelids clear  Avoid using any otc facial creams or make up on eyelids for now until rash cleared.  Once eyelids no longer inflamed and red, may restart eye makeup one at a time to make sure not reacting to one of the products  Start Vanicream for sensitive skin cleanser, moisturizer, and SPF samples given Recommend starting Zyrtec once daily Recommend physical/mineral sunblock for face      Return in about 2 weeks (around 05/01/2022) for Eyelid derm.  I, Ardis Rowan, RMA, am acting as scribe for Willeen Niece, MD .  Documentation: I have reviewed the above documentation for accuracy and completeness, and I agree with the above.  Willeen Niece MD

## 2022-04-17 NOTE — Patient Instructions (Addendum)
Avoid using any over the counter facial creams or makeup on eyelids for now Start Hydrocortisone 2.5% ointment 2 times a day to eyelids for up to 1 week to calm down itching and redness Once eyelids calmed down start Elidel cream 2 times a day until eyelids clear  Once eyelids no longer inflamed and red, may restart eye makeup one at a time to make sure not reacting to one of the products  Start Vanicream for sensitive skin cleanser, moisturizer, and SPF samples given Recommend starting Zyrtec once daily  If need eye drops recommend Natural tears eyedrops  Recommend Physical blocker sunscreens for face Don't use Tretinoin around the eyes  Avoid using Triamcinolone cream to eyelids   Due to recent changes in healthcare laws, you may see results of your pathology and/or laboratory studies on MyChart before the doctors have had a chance to review them. We understand that in some cases there may be results that are confusing or concerning to you. Please understand that not all results are received at the same time and often the doctors may need to interpret multiple results in order to provide you with the best plan of care or course of treatment. Therefore, we ask that you please give [place] 2 business days to thoroughly review all your results before contacting the office for clarification. Should we see a critical lab result, you will be contacted sooner.   If You Need Anything After Your Visit  If you have any questions or concerns for your doctor, please call our main line at [phone] and press option 4 to reach your doctor's medical assistant. If no one answers, please leave a voicemail as directed and we will return your call as soon as possible. Messages left after 4 pm will be answered the following business day.   You may also send [place] a message via MyChart. We typically respond to MyChart messages within 1-2 business days.  For prescription refills, please ask your pharmacy to  contact our office. Our fax number is [phone].  If you have an urgent issue when the clinic is closed that cannot wait until the next business day, you can page your doctor at the number below.    Please note that while we do our best to be available for urgent issues outside of office hours, we are not available 24/7.   If you have an urgent issue and are unable to reach [place], you may choose to seek medical care at your doctor's office, retail clinic, urgent care center, or emergency room.  If you have a medical emergency, please immediately call 911 or go to the emergency department.  Pager Numbers  - Dr. Gwen Pounds: [phone]  - Dr. Neale Burly: [phone]  - Dr. Roseanne Reno: [phone]  In the event of inclement weather, please call our main line at [phone] for an update on the status of any delays or closures.  Dermatology Medication Tips: Please keep the boxes that topical medications come in in order to help keep track of the instructions about where and how to use these. Pharmacies typically print the medication instructions only on the boxes and not directly on the medication tubes.   If your medication is too expensive, please contact our office at [phone] option 4 or send [place] a message through MyChart.   We are unable to tell what your co-pay for medications will be in advance as this is different depending on your insurance coverage. However, we may be able to find a  substitute medication at lower cost or fill out paperwork to get insurance to cover a needed medication.   If a prior authorization is required to get your medication covered by your insurance company, please allow [place] 1-2 business days to complete this process.  Drug prices often vary depending on where the prescription is filled and some pharmacies may offer cheaper prices.  The website www.goodrx.com contains coupons for medications through different pharmacies. The prices here do not account for what  the cost may be with help from insurance (it may be cheaper with your insurance), but the website can give you the price if you did not use any insurance.  - You can print the associated coupon and take it with your prescription to the pharmacy.  - You may also stop by our office during regular business hours and pick up a GoodRx coupon card.  - If you need your prescription sent electronically to a different pharmacy, notify our office through [facility] or by phone at [phone] option 4.     Si Usted Necesita Algo Despus de Su Visita  Tambin puede enviarnos un mensaje a travs de Pharmacist, community. Por lo general respondemos a los mensajes de MyChart en el transcurso de 1 a 2 das hbiles.  Para renovar recetas, por favor pida a su farmacia que se ponga en contacto con nuestra oficina. Harland Dingwall de fax es [place] [phone].  Si tiene un asunto urgente cuando la clnica est cerrada y que no puede esperar hasta el siguiente da hbil, puede llamar/localizar a su doctor(a) al nmero que aparece a continuacin.   Por favor, tenga en cuenta que aunque hacemos todo lo posible para estar disponibles para asuntos urgentes fuera del horario de [place], no estamos disponibles las 24 horas del da, los 7 das de la [place].   Si tiene un problema urgente y no puede comunicarse con nosotros, puede optar por buscar atencin mdica  en el consultorio de su doctor(a), en una clnica privada, en un centro de atencin urgente o en una sala de emergencias.  Si tiene Engineering geologist, por favor llame inmediatamente al 911 o vaya a la sala de emergencias.  Nmeros de bper  - Dr. Nehemiah Massed: [phone]  - Dra. Moye: [phone]  - Dra. Nicole Kindred: [phone]  En caso de inclemencias del [place], por favor llame a Johnsie Kindred principal al [phone] para una actualizacin sobre el [place] de cualquier retraso o cierre.  Consejos para la medicacin en dermatologa: Por favor, guarde las  cajas en las que vienen los medicamentos de uso tpico para ayudarle a seguir las instrucciones sobre dnde y cmo usarlos. Las farmacias generalmente imprimen las instrucciones del medicamento slo en las cajas y no directamente en los tubos del [place].   Si su medicamento es muy caro, por favor, pngase en contacto con Zigmund Daniel llamando al [phone] y presione la opcin 4 o envenos un mensaje a travs de Pharmacist, community.   No podemos decirle cul ser su copago por los medicamentos por adelantado ya que esto es diferente dependiendo de la cobertura de su seguro. Sin embargo, es posible que podamos encontrar un medicamento sustituto a Electrical engineer un formulario para que el seguro cubra el medicamento que se considera necesario.   Si se requiere una autorizacin previa para que su compaa de seguros [place] su medicamento, por favor permtanos de 1 a 2 das hbiles para completar este proceso.  Los precios de los medicamentos varan con frecuencia dependiendo del Environmental consultant  de dnde se surte la receta y alguna farmacias pueden ofrecer precios ms baratos.  El sitio web www.goodrx.com tiene cupones para medicamentos de Airline pilot. Los precios aqu no tienen en cuenta lo que podra costar con la ayuda del seguro (puede ser ms barato con su seguro), pero el sitio web puede darle el precio si no utiliz Research scientist (physical sciences).  - Puede imprimir el cupn correspondiente y llevarlo con su receta a la farmacia.  - Tambin puede pasar por nuestra oficina durante el horario de atencin regular y Charity fundraiser una tarjeta de cupones de GoodRx.  - Si necesita que su receta se enve electrnicamente a una farmacia diferente, informe a nuestra oficina a travs de MyChart de [place] o por telfono llamando al [phone] y presione la opcin 4.

## 2022-06-04 ENCOUNTER — Ambulatory Visit: Payer: BC Managed Care – PPO | Admitting: Dermatology

## 2022-10-22 DIAGNOSIS — R5383 Other fatigue: Secondary | ICD-10-CM | POA: Diagnosis not present

## 2022-10-22 DIAGNOSIS — R509 Fever, unspecified: Secondary | ICD-10-CM | POA: Diagnosis not present

## 2022-10-22 DIAGNOSIS — R5381 Other malaise: Secondary | ICD-10-CM | POA: Diagnosis not present

## 2022-11-19 ENCOUNTER — Ambulatory Visit: Payer: BC Managed Care – PPO | Admitting: Podiatry

## 2022-11-19 ENCOUNTER — Ambulatory Visit (INDEPENDENT_AMBULATORY_CARE_PROVIDER_SITE_OTHER): Payer: BC Managed Care – PPO

## 2022-11-19 VITALS — BP 101/69 | HR 64

## 2022-11-19 DIAGNOSIS — M21612 Bunion of left foot: Secondary | ICD-10-CM

## 2022-11-19 DIAGNOSIS — M21611 Bunion of right foot: Secondary | ICD-10-CM | POA: Diagnosis not present

## 2022-11-19 NOTE — Progress Notes (Signed)
   Chief Complaint  Patient presents with   Bunions    Bilateral bunions, started having having pain 15 years ago, patient has pain while walking and wearing shoes, X-Rays taken today     Subjective: 57 y.o. 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  Gradual onset.  Patient states that about 15 years ago she was about to pursue bunion surgery at that time however she canceled her surgery because she was concerned about the postoperative recovery and she had her negative experiences regarding bunion surgery.  Over the last 15 years she has done well however she is having some pain and tenderness to the forefoot and toes now.  She presents for further treatment and evaluation  Past Medical History:  Diagnosis Date   Anxiety    Fibromyalgia 10/2016   Migraine    Stomach ulcer     Past Surgical History:  Procedure Laterality Date   CESAREAN SECTION     x2   TUBAL LIGATION      No Known Allergies   Objective: Physical Exam General: The patient is alert and oriented x3 in no acute distress.  Dermatology: Skin is cool, dry and supple bilateral lower extremities. Negative for open lesions or macerations.  Vascular: Palpable pedal pulses bilaterally. No edema or erythema noted. Capillary refill within normal limits.  Neurological: Epicritic and protective threshold grossly intact bilaterally.   Musculoskeletal Exam: Clinical evidence of bunion deformity noted to the respective foot. There is moderate pain on palpation range of motion of the first MPJ. Lateral deviation of the hallux noted consistent with hallux abductovalgus.  Radiographic Exam B/L feet 11/19/2022: Normal osseous mineralization.  No acute fractures identified.  Increased intermetatarsal angle greater than 15 with a hallux abductus angle greater than 30 noted on AP view.   Assessment: 1.  Hallux valgus  bilateral   Plan of Care:  1. Patient was evaluated. X-Rays reviewed. 2.  Today we discussed in detail surgical and conservative management of the bunion deformities.  For now we are going to pursue conservative treatment 3.  Silicone toe spacers dispensed to alleviate pressure from the adjacent second digit 4.  Appointment with orthotics department for custom molded orthotics to support the medial longitudinal arch of the foot and alleviate pressure from the forefoot 5.  Return to clinic as needed  *Husband is Dr. Humphrey Rolls, anesthesiologist and pain management, by [facility]. 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 Edrick Kins, DPM    [address], [place] [address]                Office [phone]  Fax [phone]

## 2022-11-27 ENCOUNTER — Encounter: Payer: Self-pay | Admitting: Podiatry

## 2022-11-27 ENCOUNTER — Ambulatory Visit: Payer: BC Managed Care – PPO | Admitting: Podiatry

## 2022-11-27 ENCOUNTER — Ambulatory Visit: Payer: Self-pay | Admitting: Podiatry

## 2022-11-27 DIAGNOSIS — L600 Ingrowing nail: Secondary | ICD-10-CM | POA: Diagnosis not present

## 2022-11-27 MED ORDER — NEOMYCIN-POLYMYXIN-HC 1 % OT SOLN: OTIC | 1 refills | Status: DC

## 2022-11-27 NOTE — Progress Notes (Signed)
She presents today chief concern of a painful ingrown toenail to the tibial border of the hallux left [place] tender for about a week her husband is an anesthesiologist who runs a pain clinic here in [place].  She saw Dr. Amalia Hailey for her bunion complaints.  Objective: Vital signs are stable alert oriented x 3.  Pulses are palpable.  Sharply abraded nail margin along the tibial border of the hallux left exquisitely tender on palpation mild erythema no cellulitis drainage or odor is identified.  Assessment: Ingrown toenail paronychia hallux left.  Plan: Chemical matricectomy was performed today after local anesthetic was administered she tolerated the procedure well.  She was given both oral and written home-going instruction for the care and soaking of the toe as well as a prescription of Cortisporin Otic to be applied twice daily after soaking.  I will follow-up with her in a couple of weeks she will see Dr. Amalia Hailey in the near future for orthotics.

## 2022-11-27 NOTE — Patient Instructions (Signed)

## 2022-11-28 ENCOUNTER — Ambulatory Visit (INDEPENDENT_AMBULATORY_CARE_PROVIDER_SITE_OTHER): Payer: BC Managed Care – PPO | Admitting: Podiatry

## 2022-11-28 DIAGNOSIS — M21612 Bunion of left foot: Secondary | ICD-10-CM

## 2022-11-28 DIAGNOSIS — M21611 Bunion of right foot: Secondary | ICD-10-CM

## 2022-12-03 ENCOUNTER — Other Ambulatory Visit: Payer: Self-pay | Admitting: Podiatry

## 2022-12-03 DIAGNOSIS — M21611 Bunion of right foot: Secondary | ICD-10-CM

## 2022-12-11 ENCOUNTER — Ambulatory Visit: Payer: BC Managed Care – PPO | Admitting: Podiatry

## 2022-12-11 ENCOUNTER — Encounter: Payer: Self-pay | Admitting: Podiatry

## 2022-12-11 DIAGNOSIS — L600 Ingrowing nail: Secondary | ICD-10-CM

## 2022-12-11 DIAGNOSIS — Z9889 Other specified postprocedural states: Secondary | ICD-10-CM

## 2022-12-11 NOTE — Progress Notes (Signed)
She presents today for follow-up of her matrixectomy hallux left.  States that is good I have not been covering it anymore for the past day or so.  She states is still little tender back and here but feels much better than it did previously.  She is pointing to the proximal medial nail fold.  Objective: Vital signs are stable alert and oriented x 3.  Pulses are palpable.  There is no erythema is some mild edema no cellulitis drainage or odor.  Appears to be healing very nicely.  Assessment: Well-healing surgical toe.  Plan: Encouraged her to soak for just a bit longer Epsom salts and warm water once daily.  Continue with the drops cover during the daytime but leave open to bedtime.  If not improved to 100% x 2 weeks she is to notify us immediately.

## 2023-01-09 ENCOUNTER — Other Ambulatory Visit: Payer: BC Managed Care – PPO

## 2023-01-22 ENCOUNTER — Telehealth: Payer: Self-pay | Admitting: Podiatry

## 2023-01-22 NOTE — Telephone Encounter (Signed)
Left message on vm to call back and schedule appt for orthotic pick up - currently in [place] office will ship to [place] when scheduled  Balance pending charges being posted

## 2023-01-29 ENCOUNTER — Ambulatory Visit (INDEPENDENT_AMBULATORY_CARE_PROVIDER_SITE_OTHER): Payer: BC Managed Care – PPO | Admitting: Podiatry

## 2023-01-29 ENCOUNTER — Encounter: Payer: Self-pay | Admitting: Podiatry

## 2023-01-29 VITALS — BP 109/51 | HR 78

## 2023-01-29 DIAGNOSIS — M21611 Bunion of right foot: Secondary | ICD-10-CM

## 2023-01-29 DIAGNOSIS — M21612 Bunion of left foot: Secondary | ICD-10-CM

## 2023-01-29 NOTE — Progress Notes (Signed)
Patient presents today to pick up custom molded foot orthotics recommended by Dr. Milinda Pointer.   Orthotics were dispensed and fit was satisfactory. Reviewed instructions for break-in and wear. Written instructions given to patient.  Patient will follow up as needed.   [facility] - order # [id number]

## 2023-01-29 NOTE — Patient Instructions (Signed)

## 2023-02-24 DIAGNOSIS — Z0100 Encounter for examination of eyes and vision without abnormal findings: Secondary | ICD-10-CM | POA: Diagnosis not present

## 2023-02-24 DIAGNOSIS — H25011 Cortical age-related cataract, right eye: Secondary | ICD-10-CM | POA: Diagnosis not present

## 2023-03-12 ENCOUNTER — Ambulatory Visit: Payer: BC Managed Care – PPO | Admitting: Podiatry

## 2023-03-17 ENCOUNTER — Ambulatory Visit: Payer: BC Managed Care – PPO | Admitting: Podiatry

## 2023-06-03 ENCOUNTER — Other Ambulatory Visit: Payer: Self-pay

## 2023-06-03 ENCOUNTER — Ambulatory Visit: Payer: BC Managed Care – PPO | Admitting: Internal Medicine

## 2023-06-03 DIAGNOSIS — R6889 Other general symptoms and signs: Secondary | ICD-10-CM | POA: Diagnosis not present

## 2023-06-03 DIAGNOSIS — U071 COVID-19: Secondary | ICD-10-CM

## 2023-06-03 LAB — POCT XPERT XPRESS SARS COVID-2/FLU/RSV
FLU A: NEGATIVE
FLU B: NEGATIVE
RSV RNA, PCR: NEGATIVE
SARS Coronavirus 2: POSITIVE

## 2023-06-03 MED ORDER — PAXLOVID (300/100) 20 X 150 MG & 10 X 100MG PO TBPK: 3.0000 | ORAL_TABLET | Freq: Two times a day (BID) | ORAL | 0 refills | Status: AC

## 2023-06-06 NOTE — Progress Notes (Signed)
Covid test performed for uri symptoms

## 2023-08-09 NOTE — Progress Notes (Signed)
Seen by casting department

## 2023-09-26 ENCOUNTER — Encounter: Payer: Self-pay | Admitting: Internal Medicine

## 2023-09-26 ENCOUNTER — Ambulatory Visit: Payer: BC Managed Care – PPO | Admitting: Internal Medicine

## 2023-09-26 ENCOUNTER — Ambulatory Visit (INDEPENDENT_AMBULATORY_CARE_PROVIDER_SITE_OTHER): Payer: BC Managed Care – PPO | Admitting: Internal Medicine

## 2023-09-26 VITALS — BP 102/80 | HR 65 | Ht 64.0 in | Wt 147.6 lb

## 2023-09-26 DIAGNOSIS — F411 Generalized anxiety disorder: Secondary | ICD-10-CM | POA: Insufficient documentation

## 2023-09-26 DIAGNOSIS — N952 Postmenopausal atrophic vaginitis: Secondary | ICD-10-CM | POA: Diagnosis not present

## 2023-09-26 DIAGNOSIS — Z1231 Encounter for screening mammogram for malignant neoplasm of breast: Secondary | ICD-10-CM

## 2023-09-26 DIAGNOSIS — N951 Menopausal and female climacteric states: Secondary | ICD-10-CM | POA: Diagnosis not present

## 2023-09-26 MED ORDER — DULOXETINE HCL 30 MG PO CPEP: 30.0000 mg | ORAL_CAPSULE | Freq: Every day | ORAL | 2 refills | Status: DC

## 2023-09-26 MED ORDER — ESTRADIOL 0.1 MG/GM VA CREA: 1.0000 | TOPICAL_CREAM | VAGINAL | 2 refills | Status: DC

## 2023-09-26 MED ORDER — HYDROXYZINE HCL 10 MG PO TABS: 10.0000 mg | ORAL_TABLET | Freq: Every day | ORAL | 2 refills | Status: DC

## 2023-09-26 NOTE — Progress Notes (Signed)
Established Patient Office Visit  Subjective:  Patient ID: Carolyn Thomas, female    DOB: 1966-06-26  Age: 57 y.o. MRN: [id number]  Chief Complaint  Patient presents with   Follow-up    Not sleeping well, and has questions for you.    Patient comes in with complaints of worsening hot flashes, night sweats, sleep disturbance and irritability along with aches and pains associated with menopause.  Her menstrual cycle stopped several years ago but she was able to manage milder symptoms without any medication.  She also had mild baseline anxiety but still did not feel the need for medications.  However now she is suffering from fatigue due to poor sleep and is very uncomfortable with the generalized aches and pains. Her PHQ-9/GAD score is 11/18. She is agreeable to start back on 30 mg/day.  Will add hydroxyzine at bedtime which will also help with her sleep. Patient will think about HRT, as she does not have any family history of breast or endometrial cancer. Needs to get a mammogram for this year.  Last Pap was 2022.  Also up-to-date on her colonoscopy. Meanwhile we will add Estrace vaginal cream to be used twice a week for vaginal atrophy symptoms. She exercises regularly and and follows a healthy diet.    No other concerns at this time.   Past Medical History:  Diagnosis Date   Anxiety    Fibromyalgia 10/2016   Migraine    Stomach ulcer     Past Surgical History:  Procedure Laterality Date   CESAREAN SECTION     x2   TUBAL LIGATION      Social History   Socioeconomic History   Marital status: Married    Spouse name: Not on file   Number of children: Not on file   Years of education: Not on file   Highest education level: Not on file  Occupational History   Not on file  Tobacco Use   Smoking status: Never   Smokeless tobacco: Never  Vaping Use   Vaping status: Never Used  Substance and Sexual Activity   Alcohol use: No   Drug use: No   Sexual activity: Yes     Birth control/protection: Surgical    Comment: Tubal ligation   Other Topics Concern   Not on file  Social History Narrative   Lives in [place]. Works out regularly. Eats healthy diet. 3 children, 2 living at home. Has cat. Assists in office.   Social Determinants of Health   Financial Resource Strain: Not on file  Food Insecurity: Not on file  Transportation Needs: Not on file  Physical Activity: Not on file  Stress: Not on file  Social Connections: Not on file  Intimate Partner Violence: Not on file    Family History  Problem Relation Age of Onset   Heart disease Father        age 93    No Known Allergies  Outpatient Medications Prior to Visit  Medication Sig   mupirocin ointment (BACTROBAN) 2 % Apply to aa ear TID until healed. (Patient not taking: Reported on 10/15/2021)   pimecrolimus (ELIDEL) 1 % cream APPLY TOPICALLY 2 (TWO) TIMES DAILY. APPLY TO RASH TWICE DAILY AS NEEDED (Patient not taking: Reported on 01/29/2023)   [DISCONTINUED] clonazePAM (KLONOPIN) 0.5 MG tablet TAKE 1 TABLET BY MOUTH AT BEDTIME   [DISCONTINUED] DULoxetine (CYMBALTA) 30 MG capsule TAKE 1 CAPSULE (30 MG TOTAL) BY MOUTH DAILY.   [DISCONTINUED] estradiol (ESTRACE VAGINAL) 0.1 MG/GM vaginal cream  Place 2 g vaginally daily. (Patient not taking: Reported on 01/29/2023)   [DISCONTINUED] hydrocortisone 2.5 % ointment Twice daily to eyelids for up to 1 week. (Patient not taking: Reported on 09/26/2023)   [DISCONTINUED] Multiple Vitamin (MULTIVITAMIN) capsule Take 1 capsule by mouth daily.   (Patient not taking: Reported on 10/15/2021)   [DISCONTINUED] NEOMYCIN-POLYMYXIN-HYDROCORTISONE (CORTISPORIN) 1 % SOLN OTIC solution Apply 1-2 drops to toe BID after soaking (Patient not taking: Reported on 01/29/2023)   [DISCONTINUED] tretinoin (RETIN-A) 0.1 % cream Apply topically at bedtime. (Patient not taking: Reported on 10/15/2021)   [DISCONTINUED] triamcinolone cream (KENALOG) 0.1 % Apply 1 application topically 2  (two) times daily as needed. To affected areas at arms for up to 2 weeks as needed for rash. Avoid applying to face, groin, and axilla. Use as directed. Risk of skin atrophy with long-term use reviewed. (Patient not taking: Reported on 10/15/2021)   [DISCONTINUED] triamcinolone ointment (KENALOG) 0.1 % Apply 1 application topically 2 (two) times daily. (Patient not taking: Reported on 12/17/2018)   No facility-administered medications prior to visit.    Review of Systems  Constitutional:  Positive for malaise/fatigue. Negative for chills, fever and weight loss.  HENT: Negative.    Eyes: Negative.   Respiratory: Negative.  Negative for cough and shortness of breath.   Cardiovascular: Negative.  Negative for chest pain, palpitations and leg swelling.  Gastrointestinal: Negative.  Negative for abdominal pain, constipation, diarrhea, heartburn, nausea and vomiting.  Genitourinary: Negative.  Negative for dysuria and flank pain.  Musculoskeletal: Negative.  Negative for joint pain and myalgias.  Skin: Negative.   Neurological: Negative.  Negative for dizziness and headaches.  Endo/Heme/Allergies: Negative.   Psychiatric/Behavioral: Negative.  Negative for depression and suicidal ideas. The patient is not nervous/anxious.        Objective:   BP 102/80   Pulse 65   Ht 5\' 4"  (1.626 m)   Wt 147 lb 9.6 oz (67 kg)   SpO2 100%   BMI 25.34 kg/m   Vitals:   09/26/23 1039  BP: 102/80  Pulse: 65  Height: 5\' 4"  (1.626 m)  Weight: 147 lb 9.6 oz (67 kg)  SpO2: 100%  BMI (Calculated): 25.32    Physical Exam Vitals and nursing note reviewed.  Constitutional:      Appearance: Normal appearance.  HENT:     Head: Normocephalic and atraumatic.     Nose: Nose normal.     Mouth/Throat:     Mouth: Mucous membranes are moist.     Pharynx: Oropharynx is clear.  Eyes:     Conjunctiva/sclera: Conjunctivae normal.     Pupils: Pupils are equal, round, and reactive to light.  Cardiovascular:      Rate and Rhythm: Normal rate and regular rhythm.     Pulses: Normal pulses.     Heart sounds: Normal heart sounds. No murmur heard. Pulmonary:     Effort: Pulmonary effort is normal.     Breath sounds: Normal breath sounds. No wheezing.  Abdominal:     General: Bowel sounds are normal.     Palpations: Abdomen is soft.     Tenderness: There is no abdominal tenderness. There is no right CVA tenderness or left CVA tenderness.  Musculoskeletal:        General: Normal range of motion.     Cervical back: Normal range of motion.     Right lower leg: No edema.     Left lower leg: No edema.  Skin:    General: Skin  is warm and dry.  Neurological:     General: No focal deficit present.     Mental Status: She is alert and oriented to person, place, and time.  Psychiatric:        Mood and Affect: Mood normal.        Behavior: Behavior normal.      No results found for any visits on 09/26/23.  No results found for this or any previous visit (from the past 2160 hour(s)).    Assessment & Plan:  On Cymbalta, hydroxyzine Estrace vaginal cream. Schedule mammogram. Consider HRT for a short duration ,if symptoms do not improve. Problem List Items Addressed This Visit     Vaginal atrophy   Relevant Medications   estradiol (ESTRACE VAGINAL) 0.1 MG/GM vaginal cream   GAD (generalized anxiety disorder)   Relevant Medications   DULoxetine (CYMBALTA) 30 MG capsule   hydrOXYzine (ATARAX) 10 MG tablet   Menopausal and female climacteric states - Primary   Other Visit Diagnoses     Breast cancer screening by mammogram       Relevant Orders   MM 3D SCREENING MAMMOGRAM BILATERAL BREAST       Return in about 3 weeks (around 10/17/2023).   Total time spent: 30 minutes  Margaretann Loveless, MD  09/26/2023   This document may have been prepared by [facility] Voice Recognition software and as such may include unintentional dictation errors.

## 2023-10-17 ENCOUNTER — Ambulatory Visit
Admission: RE | Admit: 2023-10-17 | Discharge: 2023-10-17 | Disposition: A | Discharge status: Home / Self Care | Payer: Self-pay | Source: Ambulatory Visit | Attending: Internal Medicine | Admitting: Internal Medicine

## 2023-10-17 ENCOUNTER — Ambulatory Visit (INDEPENDENT_AMBULATORY_CARE_PROVIDER_SITE_OTHER): Payer: BC Managed Care – PPO | Admitting: Internal Medicine

## 2023-10-17 ENCOUNTER — Ambulatory Visit
Admission: RE | Admit: 2023-10-17 | Discharge: 2023-10-17 | Disposition: A | Discharge status: Home / Self Care | Payer: Self-pay | Attending: Internal Medicine | Admitting: Internal Medicine

## 2023-10-17 ENCOUNTER — Encounter: Payer: Self-pay | Admitting: Internal Medicine

## 2023-10-17 VITALS — BP 102/70 | HR 75 | Ht 64.0 in | Wt 146.8 lb

## 2023-10-17 DIAGNOSIS — M542 Cervicalgia: Secondary | ICD-10-CM | POA: Insufficient documentation

## 2023-10-17 DIAGNOSIS — M5412 Radiculopathy, cervical region: Secondary | ICD-10-CM

## 2023-10-17 DIAGNOSIS — F411 Generalized anxiety disorder: Secondary | ICD-10-CM

## 2023-10-17 MED ORDER — GABAPENTIN 100 MG PO CAPS: 100.0000 mg | ORAL_CAPSULE | Freq: Every day | ORAL | 2 refills | Status: DC

## 2023-10-17 NOTE — Progress Notes (Signed)
Established Patient Office Visit  Subjective:  Patient ID: Carolyn Thomas, female    DOB: 16-Sep-1966  Age: 57 y.o. MRN: [id number]  Chief Complaint  Patient presents with   Follow-up    3 week    Patient comes in for her follow-up today.  At her last visit she was started on Cymbalta 30 mg/day, hydroxyzine 10 mg at night.  She is noticing an improvement after taking the Cymbalta for several weeks.  There is less anxiety and mood swings and also experiencing less muscle cramps and generalized achiness.  However she is still having trouble sleeping at night, despite taking magnesium as well as hydroxyzine 10 mg as needed.  She admits that she is not taking hydroxyzine regularly.  Patient also notes that she has intermittent neck pain along with pain and achiness in her left arm.  There is no joint discomfort or swelling and restricted range of motion but feels radicular type of pain along her left arm without any tingling or numbness in her hands.  Will check x-ray of the C-spine.  Consider trial of gabapentin 100 mg p.o. at bedtime, which will also help her sleep better. She has used the estrogen vaginal cream but not regularly, as prescribed.  She notices improvement in her bladder control as well.  Advised to use it regularly for more beneficial effect.    No other concerns at this time.   Past Medical History:  Diagnosis Date   Anxiety    Fibromyalgia 10/2016   Migraine    Stomach ulcer     Past Surgical History:  Procedure Laterality Date   CESAREAN SECTION     x2   TUBAL LIGATION      Social History   Socioeconomic History   Marital status: Married    Spouse name: Not on file   Number of children: Not on file   Years of education: Not on file   Highest education level: Not on file  Occupational History   Not on file  Tobacco Use   Smoking status: Never   Smokeless tobacco: Never  Vaping Use   Vaping status: Never Used  Substance and Sexual Activity   Alcohol use:  No   Drug use: No   Sexual activity: Yes    Birth control/protection: Surgical    Comment: Tubal ligation   Other Topics Concern   Not on file  Social History Narrative   Lives in [place]. Works out regularly. Eats healthy diet. 3 children, 2 living at home. Has cat. Assists in office.   Social Determinants of Health   Financial Resource Strain: Not on file  Food Insecurity: Not on file  Transportation Needs: Not on file  Physical Activity: Not on file  Stress: Not on file  Social Connections: Not on file  Intimate Partner Violence: Not on file    Family History  Problem Relation Age of Onset   Heart disease Father        age 12    No Known Allergies  Outpatient Medications Prior to Visit  Medication Sig   DULoxetine (CYMBALTA) 30 MG capsule Take 1 capsule (30 mg total) by mouth daily.   estradiol (ESTRACE VAGINAL) 0.1 MG/GM vaginal cream Place 1 Applicatorful vaginally 3 (three) times a week.   hydrOXYzine (ATARAX) 10 MG tablet Take 1 tablet (10 mg total) by mouth at bedtime.   pimecrolimus (ELIDEL) 1 % cream APPLY TOPICALLY 2 (TWO) TIMES DAILY. APPLY TO RASH TWICE DAILY AS NEEDED (Patient not  taking: Reported on 01/29/2023)   [DISCONTINUED] mupirocin ointment (BACTROBAN) 2 % Apply to aa ear TID until healed. (Patient not taking: Reported on 10/15/2021)   No facility-administered medications prior to visit.    Review of Systems  Constitutional: Negative.  Negative for chills, fever and weight loss.  HENT: Negative.    Eyes: Negative.   Respiratory: Negative.  Negative for cough and shortness of breath.   Cardiovascular: Negative.  Negative for chest pain, palpitations and leg swelling.  Gastrointestinal: Negative.  Negative for abdominal pain, constipation, diarrhea, heartburn, nausea and vomiting.  Genitourinary: Negative.  Negative for dysuria and flank pain.  Musculoskeletal: Negative.  Negative for joint pain and myalgias.  Skin: Negative.   Neurological:  Negative.  Negative for dizziness and headaches.  Endo/Heme/Allergies: Negative.   Psychiatric/Behavioral: Negative.  Negative for depression and suicidal ideas. The patient is not nervous/anxious.        Objective:   BP 102/70   Pulse 75   Ht 5\' 4"  (1.626 m)   Wt 146 lb 12.8 oz (66.6 kg)   SpO2 97%   BMI 25.20 kg/m   Vitals:   10/17/23 1313  BP: 102/70  Pulse: 75  Height: 5\' 4"  (1.626 m)  Weight: 146 lb 12.8 oz (66.6 kg)  SpO2: 97%  BMI (Calculated): 25.19    Physical Exam Vitals and nursing note reviewed.  Constitutional:      Appearance: Normal appearance.  HENT:     Head: Normocephalic and atraumatic.     Nose: Nose normal.     Mouth/Throat:     Mouth: Mucous membranes are moist.     Pharynx: Oropharynx is clear.  Eyes:     Conjunctiva/sclera: Conjunctivae normal.     Pupils: Pupils are equal, round, and reactive to light.  Cardiovascular:     Rate and Rhythm: Normal rate and regular rhythm.     Pulses: Normal pulses.     Heart sounds: Normal heart sounds. No murmur heard. Pulmonary:     Effort: Pulmonary effort is normal.     Breath sounds: Normal breath sounds. No wheezing.  Abdominal:     General: Bowel sounds are normal.     Palpations: Abdomen is soft.     Tenderness: There is no abdominal tenderness. There is no right CVA tenderness or left CVA tenderness.  Musculoskeletal:        General: Normal range of motion.     Cervical back: Normal range of motion.     Right lower leg: No edema.     Left lower leg: No edema.  Skin:    General: Skin is warm and dry.  Neurological:     General: No focal deficit present.     Mental Status: She is alert and oriented to person, place, and time.  Psychiatric:        Mood and Affect: Mood normal.        Behavior: Behavior normal.      No results found for any visits on 10/17/23.  No results found for this or any previous visit (from the past 2160 hour(s)).    Assessment & Plan:  C-spine x-rays.  Add  gabapentin 100 mg at bedtime.  Continue Cymbalta 30 mg/day. Problem List Items Addressed This Visit   None Visit Diagnoses     Cervicalgia    -  Primary   Relevant Medications   gabapentin (NEURONTIN) 100 MG capsule   Other Relevant Orders   DG Cervical Spine Complete   Cervical  radiculopathy       Relevant Medications   gabapentin (NEURONTIN) 100 MG capsule       Follow up 8 weeks   Total time spent: 30 minutes  Margaretann Loveless, MD  10/17/2023   This document may have been prepared by Dragon Voice Recognition software and as such may include unintentional dictation errors.

## 2023-10-18 ENCOUNTER — Other Ambulatory Visit: Payer: Self-pay | Admitting: Internal Medicine

## 2023-10-18 DIAGNOSIS — F411 Generalized anxiety disorder: Secondary | ICD-10-CM

## 2023-11-14 ENCOUNTER — Ambulatory Visit: Payer: 59 | Admitting: Internal Medicine

## 2023-11-27 ENCOUNTER — Ambulatory Visit: Payer: 59 | Admitting: Internal Medicine

## 2023-11-27 ENCOUNTER — Encounter: Payer: Self-pay | Admitting: Internal Medicine

## 2023-11-27 VITALS — BP 104/66 | HR 87 | Temp 97.2°F | Ht 64.0 in | Wt 147.4 lb

## 2023-11-27 DIAGNOSIS — J069 Acute upper respiratory infection, unspecified: Secondary | ICD-10-CM

## 2023-11-27 DIAGNOSIS — R6889 Other general symptoms and signs: Secondary | ICD-10-CM

## 2023-11-27 LAB — POCT XPERT XPRESS SARS COVID-2/FLU/RSV
FLU A: NEGATIVE
FLU B: NEGATIVE
RSV RNA, PCR: NEGATIVE
SARS Coronavirus 2: NEGATIVE

## 2023-11-27 NOTE — Progress Notes (Signed)
Established Patient Office Visit  Subjective:  Patient ID: Carolyn Thomas, female    DOB: [birth year]  Age: 58 y.o. MRN: [id number]  Chief Complaint  Patient presents with   Sore Throat   Nasal Congestion    Patient comes in with complaint of sore throat, nasal congestion, fevers and chills which started this morning.  Patient has been traveling by air recently, without a mask, so exposure is a possibility.  However has no cough, no chest pain at this time but has mild bodyaches and feels tired. Her COVID/flu/RSV test is negative in the office. Suspect viral illness.  Advised to isolate, rest, fluids and take Tylenol as needed.    No other concerns at this time.   Past Medical History:  Diagnosis Date   Anxiety    Fibromyalgia 10/2016   Migraine    Stomach ulcer     Past Surgical History:  Procedure Laterality Date   CESAREAN SECTION     x2   TUBAL LIGATION      Social History   Socioeconomic History   Marital status: Married    Spouse name: Not on file   Number of children: Not on file   Years of education: Not on file   Highest education level: Not on file  Occupational History   Not on file  Tobacco Use   Smoking status: Never   Smokeless tobacco: Never  Vaping Use   Vaping status: Never Used  Substance and Sexual Activity   Alcohol use: No   Drug use: No   Sexual activity: Yes    Birth control/protection: Surgical    Comment: Tubal ligation   Other Topics Concern   Not on file  Social History Narrative   Lives in [place]. Works out regularly. Eats healthy diet. 3 children, 2 living at home. Has cat. Assists in office.   Social Drivers of Corporate investment banker Strain: Not on file  Food Insecurity: Not on file  Transportation Needs: Not on file  Physical Activity: Not on file  Stress: Not on file  Social Connections: Not on file  Intimate Partner Violence: Not on file    Family History  Problem Relation Age of Onset   Heart disease  Father        age 66    No Known Allergies  Outpatient Medications Prior to Visit  Medication Sig   DULoxetine (CYMBALTA) 30 MG capsule TAKE 1 CAPSULE BY MOUTH EVERY DAY   estradiol (ESTRACE VAGINAL) 0.1 MG/GM vaginal cream Place 1 Applicatorful vaginally 3 (three) times a week.   gabapentin (NEURONTIN) 100 MG capsule Take 1 capsule (100 mg total) by mouth at bedtime.   hydrOXYzine (ATARAX) 10 MG tablet TAKE 1 TABLET BY MOUTH EVERYDAY AT BEDTIME   pimecrolimus (ELIDEL) 1 % cream APPLY TOPICALLY 2 (TWO) TIMES DAILY. APPLY TO RASH TWICE DAILY AS NEEDED (Patient not taking: Reported on 11/27/2023)   No facility-administered medications prior to visit.    Review of Systems  Constitutional:  Positive for chills, fever and malaise/fatigue.  HENT:  Positive for congestion and sore throat.   Eyes: Negative.   Respiratory: Negative.  Negative for cough and shortness of breath.   Cardiovascular: Negative.  Negative for chest pain, palpitations and leg swelling.  Gastrointestinal: Negative.  Negative for abdominal pain, constipation, diarrhea, heartburn, nausea and vomiting.  Genitourinary: Negative.  Negative for dysuria and flank pain.  Musculoskeletal:  Positive for myalgias. Negative for joint pain.  Skin: Negative.   Neurological:  Negative.  Negative for dizziness and headaches.  Endo/Heme/Allergies: Negative.   Psychiatric/Behavioral: Negative.  Negative for depression and suicidal ideas. The patient is not nervous/anxious.        Objective:   BP 104/66   Pulse 87   Temp (!) 97.2 F (36.2 C) (Tympanic)   Ht 5\' 4"  (1.626 m)   Wt 147 lb 6.4 oz (66.9 kg)   SpO2 98%   BMI 25.30 kg/m   Vitals:   11/27/23 1348  BP: 104/66  Pulse: 87  Temp: (!) 97.2 F (36.2 C)  Height: 5\' 4"  (1.626 m)  Weight: 147 lb 6.4 oz (66.9 kg)  SpO2: 98%  TempSrc: Tympanic  BMI (Calculated): 25.29    Physical Exam Vitals and nursing note reviewed.  Constitutional:      Appearance: Normal  appearance.  HENT:     Head: Normocephalic and atraumatic.     Nose: Nose normal.     Mouth/Throat:     Mouth: Mucous membranes are moist.     Pharynx: Oropharynx is clear.  Eyes:     Conjunctiva/sclera: Conjunctivae normal.     Pupils: Pupils are equal, round, and reactive to light.  Cardiovascular:     Rate and Rhythm: Normal rate and regular rhythm.     Pulses: Normal pulses.     Heart sounds: Normal heart sounds. No murmur heard. Pulmonary:     Effort: Pulmonary effort is normal.     Breath sounds: Normal breath sounds. No wheezing.  Abdominal:     General: Bowel sounds are normal.     Palpations: Abdomen is soft.     Tenderness: There is no abdominal tenderness. There is no right CVA tenderness or left CVA tenderness.  Musculoskeletal:        General: Normal range of motion.     Cervical back: Normal range of motion.     Right lower leg: No edema.     Left lower leg: No edema.  Skin:    General: Skin is warm and dry.  Neurological:     General: No focal deficit present.     Mental Status: She is alert and oriented to person, place, and time.  Psychiatric:        Mood and Affect: Mood normal.        Behavior: Behavior normal.      Results for orders placed or performed in visit on 11/27/23  POCT XPERT XPRESS SARS COVID-2/FLU/RSV  Result Value Ref Range   SARS Coronavirus 2 neg    FLU A neg    FLU B neg    RSV RNA, PCR neg     Recent Results (from the past 2160 hours)  POCT XPERT XPRESS SARS COVID-2/FLU/RSV     Status: None   Collection Time: 11/27/23  2:35 PM  Result Value Ref Range   SARS Coronavirus 2 neg    FLU A neg    FLU B neg    RSV RNA, PCR neg       Assessment & Plan:  Patient advised rest, flu, Tylenol as needed. Monitor for evolution of symptoms. Problem List Items Addressed This Visit   None Visit Diagnoses       Flu-like symptoms    -  Primary   Relevant Orders   POCT XPERT XPRESS SARS COVID-2/FLU/RSV (Completed)       Follow up  as scheduled.  Total time spent: 25 minutes  Margaretann Loveless, MD  11/27/2023   This document may have been  prepared by [employer] and as such may include unintentional dictation errors.

## 2023-11-28 NOTE — Progress Notes (Signed)
Patient notified

## 2023-12-28 ENCOUNTER — Other Ambulatory Visit: Payer: Self-pay | Admitting: Internal Medicine

## 2023-12-28 DIAGNOSIS — N952 Postmenopausal atrophic vaginitis: Secondary | ICD-10-CM

## 2024-01-26 ENCOUNTER — Encounter: Payer: Self-pay | Admitting: Internal Medicine

## 2024-01-26 ENCOUNTER — Ambulatory Visit (INDEPENDENT_AMBULATORY_CARE_PROVIDER_SITE_OTHER): Admitting: Internal Medicine

## 2024-01-26 VITALS — BP 108/76 | HR 91 | Ht 64.0 in | Wt 140.0 lb

## 2024-01-26 DIAGNOSIS — F5102 Adjustment insomnia: Secondary | ICD-10-CM

## 2024-01-26 DIAGNOSIS — Z0130 Encounter for examination of blood pressure without abnormal findings: Secondary | ICD-10-CM

## 2024-01-26 DIAGNOSIS — N951 Menopausal and female climacteric states: Secondary | ICD-10-CM | POA: Diagnosis not present

## 2024-01-26 DIAGNOSIS — F411 Generalized anxiety disorder: Secondary | ICD-10-CM | POA: Diagnosis not present

## 2024-01-26 MED ORDER — ZOLPIDEM TARTRATE 5 MG PO TABS: 5.0000 mg | ORAL_TABLET | Freq: Every evening | ORAL | 0 refills | Status: DC | PRN

## 2024-01-26 NOTE — Progress Notes (Signed)
 Established Patient Office Visit  Subjective:  Patient ID: Carolyn Thomas, female    DOB: [birth year]  Age: 58 y.o. MRN: [id number]  Chief Complaint  Patient presents with   Follow-up    Needs meds for travel    Patient comes in for her follow-up today.  She is taking all her medications as prescribed and is feeling much better.  Her anxiety is under good control as well.  Her PHQ-9/GAD score is now 0/0.  However she still has trouble sleeping at night.  Requests for a few Ambien tablets as she will be traveling.      No other concerns at this time.   Past Medical History:  Diagnosis Date   Anxiety    Fibromyalgia 10/2016   Migraine    Stomach ulcer     Past Surgical History:  Procedure Laterality Date   CESAREAN SECTION     x2   TUBAL LIGATION      Social History   Socioeconomic History   Marital status: Married    Spouse name: Not on file   Number of children: Not on file   Years of education: Not on file   Highest education level: Not on file  Occupational History   Not on file  Tobacco Use   Smoking status: Never   Smokeless tobacco: Never  Vaping Use   Vaping status: Never Used  Substance and Sexual Activity   Alcohol use: No   Drug use: No   Sexual activity: Yes    Birth control/protection: Surgical    Comment: Tubal ligation   Other Topics Concern   Not on file  Social History Narrative   Lives in [place]. Works out regularly. Eats healthy diet. 3 children, 2 living at home. Has cat. Assists in office.   Social Drivers of Corporate investment banker Strain: Not on file  Food Insecurity: Not on file  Transportation Needs: Not on file  Physical Activity: Not on file  Stress: Not on file  Social Connections: Not on file  Intimate Partner Violence: Not on file    Family History  Problem Relation Age of Onset   Heart disease Father        age 44    No Known Allergies  Outpatient Medications Prior to Visit  Medication Sig   DULoxetine  (CYMBALTA) 30 MG capsule TAKE 1 CAPSULE BY MOUTH EVERY DAY   estradiol (ESTRACE) 0.1 MG/GM vaginal cream PLACE 1 APPLICATORFUL VAGINALLY 3 (THREE) TIMES A WEEK.   gabapentin (NEURONTIN) 100 MG capsule Take 1 capsule (100 mg total) by mouth at bedtime.   pimecrolimus (ELIDEL) 1 % cream APPLY TOPICALLY 2 (TWO) TIMES DAILY. APPLY TO RASH TWICE DAILY AS NEEDED   tretinoin (RETIN-A) 0.1 % cream Apply topically daily.   [DISCONTINUED] hydrOXYzine (ATARAX) 10 MG tablet TAKE 1 TABLET BY MOUTH EVERYDAY AT BEDTIME   No facility-administered medications prior to visit.    Review of Systems  Constitutional: Negative.  Negative for chills, fever, malaise/fatigue and weight loss.  HENT: Negative.  Negative for sinus pain.   Eyes: Negative.   Respiratory: Negative.  Negative for cough and shortness of breath.   Cardiovascular: Negative.  Negative for chest pain, palpitations and leg swelling.  Gastrointestinal: Negative.  Negative for abdominal pain, blood in stool, constipation, diarrhea, heartburn, melena, nausea and vomiting.  Genitourinary: Negative.  Negative for dysuria and flank pain.  Musculoskeletal: Negative.  Negative for joint pain and myalgias.  Skin: Negative.   Neurological: Negative.  Negative for dizziness and headaches.  Endo/Heme/Allergies: Negative.   Psychiatric/Behavioral:  Negative for depression and suicidal ideas. The patient has insomnia. The patient is not nervous/anxious.        Objective:   BP 108/76   Pulse 91   Ht 5\' 4"  (1.626 m)   Wt 140 lb (63.5 kg)   SpO2 97%   BMI 24.03 kg/m   Vitals:   01/26/24 1304  BP: 108/76  Pulse: 91  Height: 5\' 4"  (1.626 m)  Weight: 140 lb (63.5 kg)  SpO2: 97%  BMI (Calculated): 24.02    Physical Exam Vitals and nursing note reviewed.  Constitutional:      Appearance: Normal appearance.  HENT:     Head: Normocephalic and atraumatic.     Nose: Nose normal.     Mouth/Throat:     Mouth: Mucous membranes are moist.      Pharynx: Oropharynx is clear.  Eyes:     Conjunctiva/sclera: Conjunctivae normal.     Pupils: Pupils are equal, round, and reactive to light.  Cardiovascular:     Rate and Rhythm: Normal rate and regular rhythm.     Pulses: Normal pulses.     Heart sounds: Normal heart sounds. No murmur heard. Pulmonary:     Effort: Pulmonary effort is normal.     Breath sounds: Normal breath sounds. No wheezing.  Abdominal:     General: Bowel sounds are normal.     Palpations: Abdomen is soft.     Tenderness: There is no abdominal tenderness. There is no right CVA tenderness or left CVA tenderness.  Musculoskeletal:        General: Normal range of motion.     Cervical back: Normal range of motion.     Right lower leg: No edema.     Left lower leg: No edema.  Skin:    General: Skin is warm and dry.  Neurological:     General: No focal deficit present.     Mental Status: She is alert and oriented to person, place, and time.  Psychiatric:        Mood and Affect: Mood normal.        Behavior: Behavior normal.      No results found for any visits on 01/26/24.  Recent Results (from the past 2160 hours)  POCT XPERT XPRESS SARS COVID-2/FLU/RSV     Status: None   Collection Time: 11/27/23  2:35 PM  Result Value Ref Range   SARS Coronavirus 2 neg    FLU A neg    FLU B neg    RSV RNA, PCR neg       Assessment & Plan:  Continue current medications. Prescription sent for Ambien 5 mg, to be taken only as needed. Problem List Items Addressed This Visit     Insomnia   Relevant Medications   zolpidem (AMBIEN) 5 MG tablet   GAD (generalized anxiety disorder) - Primary   Menopausal and female climacteric states    Follow up 4 months.  Total time spent: 25 minutes  Margaretann Loveless, MD  01/26/2024   This document may have been prepared by [facility] Voice Recognition software and as such may include unintentional dictation errors.

## 2024-02-06 ENCOUNTER — Other Ambulatory Visit: Payer: Self-pay | Admitting: Internal Medicine

## 2024-02-06 DIAGNOSIS — M5412 Radiculopathy, cervical region: Secondary | ICD-10-CM

## 2024-02-06 DIAGNOSIS — M542 Cervicalgia: Secondary | ICD-10-CM

## 2024-08-10 ENCOUNTER — Ambulatory Visit: Admitting: Internal Medicine

## 2024-08-13 ENCOUNTER — Ambulatory Visit: Admitting: Internal Medicine

## 2024-08-13 ENCOUNTER — Ambulatory Visit
Admission: RE | Admit: 2024-08-13 | Discharge: 2024-08-13 | Disposition: A | Discharge status: Home / Self Care | Source: Ambulatory Visit | Attending: Internal Medicine | Admitting: Internal Medicine

## 2024-08-13 ENCOUNTER — Ambulatory Visit (INDEPENDENT_AMBULATORY_CARE_PROVIDER_SITE_OTHER): Admitting: Internal Medicine

## 2024-08-13 ENCOUNTER — Ambulatory Visit
Admission: RE | Admit: 2024-08-13 | Discharge: 2024-08-13 | Disposition: A | Discharge status: Home / Self Care | Attending: Internal Medicine | Admitting: Internal Medicine

## 2024-08-13 ENCOUNTER — Encounter: Payer: Self-pay | Admitting: Internal Medicine

## 2024-08-13 VITALS — BP 114/68 | HR 60 | Ht 64.0 in | Wt 142.4 lb

## 2024-08-13 DIAGNOSIS — G8929 Other chronic pain: Secondary | ICD-10-CM

## 2024-08-13 DIAGNOSIS — M542 Cervicalgia: Secondary | ICD-10-CM

## 2024-08-13 DIAGNOSIS — M792 Neuralgia and neuritis, unspecified: Secondary | ICD-10-CM | POA: Diagnosis not present

## 2024-08-13 DIAGNOSIS — Z1231 Encounter for screening mammogram for malignant neoplasm of breast: Secondary | ICD-10-CM

## 2024-08-13 DIAGNOSIS — M25512 Pain in left shoulder: Secondary | ICD-10-CM | POA: Diagnosis present

## 2024-08-13 DIAGNOSIS — M5030 Other cervical disc degeneration, unspecified cervical region: Secondary | ICD-10-CM | POA: Insufficient documentation

## 2024-08-13 DIAGNOSIS — Z0130 Encounter for examination of blood pressure without abnormal findings: Secondary | ICD-10-CM

## 2024-08-13 MED ORDER — PREDNISONE 20 MG PO TABS: 40.0000 mg | ORAL_TABLET | Freq: Every day | ORAL | 0 refills | Status: AC

## 2024-08-13 MED ORDER — BACLOFEN 10 MG PO TABS: 10.0000 mg | ORAL_TABLET | Freq: Every day | ORAL | 1 refills | Status: DC

## 2024-08-13 NOTE — Progress Notes (Signed)
 Established Patient Office Visit  Subjective:  Patient ID: Carolyn Thomas, female    DOB: 1966/04/04  Age: 58 y.o. MRN: [id number]  Chief Complaint  Patient presents with   Shoulder Pain    Left shoulder/arm pain    Patient comes in with complaints of left upper extremity radicular type of pain, present for several months and now getting worse.  Patient had presented with similar complaints in December 2024, and her C-spine x-ray had showed degenerative disc disease along with mild neural foraminal narrowing of the right side.  Patient was started on gabapentin  and her symptoms responded, however she she reports that she has stopped taking all her medications.  Patient does not have pain and tenderness over her left shoulder and there is no reduced range of motion.  There is pain and tenderness along the left Trapezius muscle and then she feels pain in her left upper arm and left forearm.  Does not have any tingling numbness or motor weakness of her left hand. Patient does regular weight training and lifts heavy weights however she had stopped doing that for about 1 week, does not recall any acute injury during her training.    No other concerns at this time.   Past Medical History:  Diagnosis Date   Anxiety    Fibromyalgia 10/2016   Migraine    Stomach ulcer     Past Surgical History:  Procedure Laterality Date   CESAREAN SECTION     x2   TUBAL LIGATION      Social History   Socioeconomic History   Marital status: Married    Spouse name: Not on file   Number of children: Not on file   Years of education: Not on file   Highest education level: Not on file  Occupational History   Not on file  Tobacco Use   Smoking status: Never   Smokeless tobacco: Never  Vaping Use   Vaping status: Never Used  Substance and Sexual Activity   Alcohol use: No   Drug use: No   Sexual activity: Yes    Birth control/protection: Surgical    Comment: Tubal ligation   Other Topics  Concern   Not on file  Social History Narrative   Lives in [place]. Works out regularly. Eats healthy diet. 3 children, 2 living at home. Has cat. Assists in office.   Social Drivers of Corporate investment banker Strain: Not on file  Food Insecurity: Not on file  Transportation Needs: Not on file  Physical Activity: Not on file  Stress: Not on file  Social Connections: Not on file  Intimate Partner Violence: Not on file    Family History  Problem Relation Age of Onset   Heart disease Father        age 25    No Known Allergies  Outpatient Medications Prior to Visit  Medication Sig   DULoxetine  (CYMBALTA ) 30 MG capsule TAKE 1 CAPSULE BY MOUTH EVERY DAY (Patient not taking: Reported on 08/13/2024)   estradiol  (ESTRACE ) 0.1 MG/GM vaginal cream PLACE 1 APPLICATORFUL VAGINALLY 3 (THREE) TIMES A WEEK. (Patient not taking: Reported on 08/13/2024)   gabapentin  (NEURONTIN ) 100 MG capsule TAKE 1 CAPSULE BY MOUTH AT BEDTIME. (Patient not taking: Reported on 08/13/2024)   pimecrolimus  (ELIDEL ) 1 % cream APPLY TOPICALLY 2 (TWO) TIMES DAILY. APPLY TO RASH TWICE DAILY AS NEEDED (Patient not taking: Reported on 08/13/2024)   tretinoin  (RETIN-A ) 0.1 % cream Apply topically daily. (Patient not taking: Reported on  08/13/2024)   zolpidem  (AMBIEN ) 5 MG tablet Take 1 tablet (5 mg total) by mouth at bedtime as needed for sleep. (Patient not taking: Reported on 08/13/2024)   No facility-administered medications prior to visit.    Review of Systems  Constitutional: Negative.  Negative for chills, fever and malaise/fatigue.  HENT: Negative.  Negative for congestion and sore throat.   Eyes: Negative.  Negative for blurred vision and pain.  Respiratory: Negative.  Negative for cough and shortness of breath.   Cardiovascular: Negative.  Negative for chest pain, palpitations and leg swelling.  Gastrointestinal: Negative.  Negative for abdominal pain, blood in stool, constipation, diarrhea, heartburn,  melena, nausea and vomiting.  Genitourinary: Negative.  Negative for dysuria, flank pain, frequency and urgency.  Musculoskeletal: Negative.  Negative for joint pain and myalgias.  Skin: Negative.   Neurological: Negative.  Negative for dizziness, tingling, sensory change, weakness and headaches.  Endo/Heme/Allergies: Negative.   Psychiatric/Behavioral: Negative.  Negative for depression and suicidal ideas. The patient is not nervous/anxious.        Objective:   BP 114/68   Pulse 60   Ht 5' 4 (1.626 m)   Wt 142 lb 6.4 oz (64.6 kg)   SpO2 98%   BMI 24.44 kg/m   Vitals:   08/13/24 1040  BP: 114/68  Pulse: 60  Height: 5' 4 (1.626 m)  Weight: 142 lb 6.4 oz (64.6 kg)  SpO2: 98%  BMI (Calculated): 24.43    Physical Exam Vitals and nursing note reviewed.  Constitutional:      Appearance: Normal appearance.  HENT:     Head: Normocephalic and atraumatic.     Nose: Nose normal.     Mouth/Throat:     Mouth: Mucous membranes are moist.     Pharynx: Oropharynx is clear.  Eyes:     Conjunctiva/sclera: Conjunctivae normal.     Pupils: Pupils are equal, round, and reactive to light.  Cardiovascular:     Rate and Rhythm: Normal rate and regular rhythm.     Pulses: Normal pulses.     Heart sounds: Normal heart sounds. No murmur heard. Pulmonary:     Effort: Pulmonary effort is normal.     Breath sounds: Normal breath sounds. No wheezing.  Abdominal:     General: Bowel sounds are normal.     Palpations: Abdomen is soft.     Tenderness: There is no abdominal tenderness. There is no right CVA tenderness or left CVA tenderness.  Musculoskeletal:        General: Normal range of motion.     Cervical back: Normal range of motion.     Right lower leg: No edema.     Left lower leg: No edema.  Skin:    General: Skin is warm and dry.  Neurological:     General: No focal deficit present.     Mental Status: She is alert and oriented to person, place, and time.  Psychiatric:         Mood and Affect: Mood normal.        Behavior: Behavior normal.      No results found for any visits on 08/13/24.  No results found for this or any previous visit (from the past 2160 hours).    Assessment & Plan:  Will get a repeat x-ray of the C-spine, x-ray of the left shoulder. Start prednisone burst and baclofen at bedtime. Most probably will need MRI of C-spine at follow-up. Patient is overdue for her mammogram, order sent  again. Problem List Items Addressed This Visit     Degenerative disc disease, cervical   Radicular pain in left arm - Primary   Other Visit Diagnoses       Cervicalgia       Relevant Medications   predniSONE (DELTASONE) 20 MG tablet   baclofen (LIORESAL) 10 MG tablet   Other Relevant Orders   DG Cervical Spine Complete     Chronic left shoulder pain       Relevant Medications   predniSONE (DELTASONE) 20 MG tablet   baclofen (LIORESAL) 10 MG tablet   Other Relevant Orders   DG Shoulder Left     Breast cancer screening by mammogram       Relevant Orders   MM 3D SCREENING MAMMOGRAM BILATERAL BREAST       Return in about 1 week (around 08/20/2024).   Total time spent: 30 minutes  FERNAND FREDY RAMAN, MD  08/13/2024   This document may have been prepared by [facility] Voice Recognition software and as such may include unintentional dictation errors.

## 2024-08-19 ENCOUNTER — Ambulatory Visit: Payer: Self-pay | Admitting: Internal Medicine

## 2024-08-19 ENCOUNTER — Ambulatory Visit

## 2024-08-20 ENCOUNTER — Ambulatory Visit: Admitting: Internal Medicine

## 2024-08-20 ENCOUNTER — Encounter: Payer: Self-pay | Admitting: Internal Medicine

## 2024-08-20 VITALS — BP 104/78 | HR 65 | Ht 64.0 in | Wt 141.2 lb

## 2024-08-20 DIAGNOSIS — Z0130 Encounter for examination of blood pressure without abnormal findings: Secondary | ICD-10-CM

## 2024-08-20 DIAGNOSIS — M792 Neuralgia and neuritis, unspecified: Secondary | ICD-10-CM | POA: Diagnosis not present

## 2024-08-20 DIAGNOSIS — M5412 Radiculopathy, cervical region: Secondary | ICD-10-CM

## 2024-08-20 DIAGNOSIS — M542 Cervicalgia: Secondary | ICD-10-CM

## 2024-08-20 NOTE — Progress Notes (Signed)
 Established Patient Office Visit  Subjective:  Patient ID: Carolyn Thomas, female    DOB: [birth year]  Age: 58 y.o. MRN: [id number]  Chief Complaint  Patient presents with   Follow-up    1 week follow up    Patient comes in for follow up of her neck and left upper extremity pain.  She continues to have severe pain and discomfort of her neck, left upper arm and radiates down to her left forearm.  She is also experiencing tingling and numbness of her left hand.   Her C-spine x-ray showed degenerative disc disease at C 4- 5 and C 5-6 with mild bony neural foraminal stenosis of the right side. Will proceed with scheduling an MRI of her C-spine, meanwhile advised to continue her medications as tolerated.    No other concerns at this time.   Past Medical History:  Diagnosis Date   Anxiety    Fibromyalgia 10/2016   Migraine    Stomach ulcer     Past Surgical History:  Procedure Laterality Date   CESAREAN SECTION     x2   TUBAL LIGATION      Social History   Socioeconomic History   Marital status: Married    Spouse name: Not on file   Number of children: Not on file   Years of education: Not on file   Highest education level: Not on file  Occupational History   Not on file  Tobacco Use   Smoking status: Never   Smokeless tobacco: Never  Vaping Use   Vaping status: Never Used  Substance and Sexual Activity   Alcohol use: No   Drug use: No   Sexual activity: Yes    Birth control/protection: Surgical    Comment: Tubal ligation   Other Topics Concern   Not on file  Social History Narrative   Lives in [place]. Works out regularly. Eats healthy diet. 3 children, 2 living at home. Has cat. Assists in office.   Social Drivers of Corporate investment banker Strain: Not on file  Food Insecurity: Not on file  Transportation Needs: Not on file  Physical Activity: Not on file  Stress: Not on file  Social Connections: Not on file  Intimate Partner Violence: Not on file     Family History  Problem Relation Age of Onset   Heart disease Father        age 79    No Known Allergies  Outpatient Medications Prior to Visit  Medication Sig   baclofen (LIORESAL) 10 MG tablet Take 1 tablet (10 mg total) by mouth daily. (Patient not taking: Reported on 08/20/2024)   DULoxetine  (CYMBALTA ) 30 MG capsule TAKE 1 CAPSULE BY MOUTH EVERY DAY (Patient not taking: Reported on 08/20/2024)   estradiol  (ESTRACE ) 0.1 MG/GM vaginal cream PLACE 1 APPLICATORFUL VAGINALLY 3 (THREE) TIMES A WEEK. (Patient not taking: Reported on 08/20/2024)   gabapentin  (NEURONTIN ) 100 MG capsule TAKE 1 CAPSULE BY MOUTH AT BEDTIME. (Patient not taking: Reported on 08/20/2024)   pimecrolimus  (ELIDEL ) 1 % cream APPLY TOPICALLY 2 (TWO) TIMES DAILY. APPLY TO RASH TWICE DAILY AS NEEDED (Patient not taking: Reported on 08/20/2024)   predniSONE (DELTASONE) 20 MG tablet Take 2 tablets (40 mg total) by mouth daily with breakfast. (Patient not taking: Reported on 08/20/2024)   tretinoin  (RETIN-A ) 0.1 % cream Apply topically daily. (Patient not taking: Reported on 08/20/2024)   zolpidem  (AMBIEN ) 5 MG tablet Take 1 tablet (5 mg total) by mouth at bedtime as needed for  sleep. (Patient not taking: Reported on 08/20/2024)   No facility-administered medications prior to visit.    Review of Systems  Constitutional: Negative.  Negative for chills, fever and malaise/fatigue.  HENT: Negative.  Negative for congestion and sore throat.   Eyes: Negative.  Negative for blurred vision and pain.  Respiratory: Negative.  Negative for cough and shortness of breath.   Cardiovascular: Negative.  Negative for chest pain, palpitations and leg swelling.  Gastrointestinal: Negative.  Negative for abdominal pain, blood in stool, constipation, diarrhea, heartburn, melena, nausea and vomiting.  Genitourinary: Negative.  Negative for dysuria, flank pain, frequency and urgency.  Musculoskeletal:  Positive for neck pain. Negative for  joint pain and myalgias.  Skin: Negative.   Neurological: Negative.  Negative for dizziness, tingling, sensory change, weakness and headaches.  Endo/Heme/Allergies: Negative.   Psychiatric/Behavioral: Negative.  Negative for depression and suicidal ideas. The patient is not nervous/anxious.        Objective:   BP 104/78   Pulse 65   Ht 5' 4 (1.626 m)   Wt 141 lb 3.2 oz (64 kg)   SpO2 99%   BMI 24.24 kg/m   Vitals:   08/20/24 1330  BP: 104/78  Pulse: 65  Height: 5' 4 (1.626 m)  Weight: 141 lb 3.2 oz (64 kg)  SpO2: 99%  BMI (Calculated): 24.23    Physical Exam Vitals and nursing note reviewed.  Constitutional:      Appearance: Normal appearance.  HENT:     Head: Normocephalic and atraumatic.     Nose: Nose normal.     Mouth/Throat:     Mouth: Mucous membranes are moist.     Pharynx: Oropharynx is clear.  Eyes:     Conjunctiva/sclera: Conjunctivae normal.     Pupils: Pupils are equal, round, and reactive to light.  Cardiovascular:     Rate and Rhythm: Normal rate and regular rhythm.     Pulses: Normal pulses.     Heart sounds: Normal heart sounds. No murmur heard. Pulmonary:     Effort: Pulmonary effort is normal.     Breath sounds: Normal breath sounds. No wheezing.  Abdominal:     General: Bowel sounds are normal.     Palpations: Abdomen is soft.     Tenderness: There is no abdominal tenderness. There is no right CVA tenderness or left CVA tenderness.  Musculoskeletal:        General: Normal range of motion.     Cervical back: Normal range of motion.     Right lower leg: No edema.     Left lower leg: No edema.  Skin:    General: Skin is warm and dry.  Neurological:     General: No focal deficit present.     Mental Status: She is alert and oriented to person, place, and time.  Psychiatric:        Mood and Affect: Mood normal.        Behavior: Behavior normal.      No results found for any visits on 08/20/24.  No results found for this or any  previous visit (from the past 2160 hours).    Assessment & Plan:  Schedule MRI of the C-spine.  Continue medications. Problem List Items Addressed This Visit     Radicular pain in left arm   Neck pain - Primary   Relevant Orders   MR Cervical Spine Wo Contrast   Other Visit Diagnoses       Cervical radicular pain  Relevant Orders   MR Cervical Spine Wo Contrast       Follow up 2 weeks.   Total time spent: 25 minutes  FERNAND FREDY RAMAN, MD  08/20/2024   This document may have been prepared by [facility] Voice Recognition software and as such may include unintentional dictation errors.

## 2024-08-20 NOTE — Progress Notes (Signed)
 Patient notified

## 2024-09-01 ENCOUNTER — Ambulatory Visit

## 2024-09-01 DIAGNOSIS — L239 Allergic contact dermatitis, unspecified cause: Secondary | ICD-10-CM | POA: Diagnosis not present

## 2024-09-01 MED ORDER — TACROLIMUS 0.1 % EX OINT: TOPICAL_OINTMENT | CUTANEOUS | 5 refills | Status: AC

## 2024-09-01 NOTE — Progress Notes (Signed)
    Subjective   Carolyn Thomas is a 58 y.o. female who presents for the following: Rash. Patient is established patient .  Today patient reports: Rash at neck and under eyes present for months, reports itching. Has been using cortisone cream to control at home and it does help with itch. Reports flares all the time. Patient had patch testing done in 2022 with no reaction.   Review of Systems:    No other skin or systemic complaints except as noted in HPI or Assessment and Plan.  The following portions of the chart were reviewed this encounter and updated as appropriate: medications, allergies, medical history  Relevant Medical History:  n/a   Objective  Well appearing patient in no apparent distress; mood and affect are within normal limits. 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 Condition is symptomatic and bothersome to patient. Patient is flaring and not currently at treatment goal.  - Prior treatments tried/failed: Cortisone cream  - True Patch Test 36 05/2021 with no reaction  - Diagnosis, treatment options, prognosis, risk/ benefit, and side effects of treatment were discussed with the patient - Discussed that this dermatitis can develop at any time and may be new or old products or other substances that the patient is coming into contact with - Recommended gentle skin care, including avoidance of fragrance products, harsh soaps and detergents - Start tacrolimus ointment 0.1% twice daily on face, under eyes.  Educated about black box warning (and also that recent studies show no increased malignancy risk) and common adverse  effects such as burning with application (advised to cool in fridge and only apply to bone-dry skin). Given location of eruption, unsafe to use daily topical CS to area. Patient requires topical calcineurin inhibitor given high risk of dyspigmentation and atrophy from topical CS use in sensitive area. - Recommend minimal makeup, using gentle skin care, avoidance of spray fragrance, minimal jewelry, nails as all of these can be common triggers for ACD. 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  Return to clinic: Return in about 6 weeks (around 10/13/2024) for rash follow-up, w/ Dr. Raymund.  I, Jacquelynn V. Wilfred, CMA, am acting as scribe for Lauraine JAYSON Raymund, MD .   Documentation: I have reviewed the above documentation for accuracy and completeness, and I agree with the above.  Lauraine JAYSON Raymund, MD

## 2024-09-01 NOTE — Patient Instructions (Addendum)
 Skin Care and Sun Protection  Your skin plays an important role in keeping the entire body healthy. Below are some tips on how to try and maximize skin health from the outside in.  Bathing  Bathe in mildly warm water every 1 to 2 days, followed by light drying and an application of a thick moisturizer cream or ointment, preferably one that comes in a tub.  Recommended body soaps/washes: - Cerave Hydrating Cleanser Bar - Dove Sensitive Skin Fragrance Free Beauty Bar - Aveeno Active Naturals Skin Relief Body Wash, Fragrance Free - Free & Clear (vanicream) liquid cleanser  Moisturizer  Body moisturizer: Apply a moisturizer throughout the day and after bathing.  When you moisturize after bathing, this locks in the moisture.  This can lead to softer and smoother skin.  Body moisturizers come in ointments, creams, and lotions.  If you have dry skin, we recommend the use of ointments or creams rather than lotions.  In other words, something you scoop out of a jar rather than squirted out.  Ointments and creams are thicker and thus provide better moisturization.    Recommended creams for all over: - Vanicream cream - CeraVe Moisturizing Cream - Eucerin Original Healing Soothing Repair Cream  Recommended ointments: greasy, but do the best job at moisturization - Plain Vaseline (petroleum jelly) - CeraVe Healing ointment - Aquaphor Healing ointment  Face moisturizers: For your face, look for something that is labeled as non-comedogenic (won't clog pores) and oil-free. Your moisturizer for the day should have SPF 30 or higher in it as well, but your moisturizer for night can be without SPF. Some good examples are: - CeraVe Moisturizing Cream (can be used as a face moisturizer) - La Roche-Posay Toleriane Double Repair Facial Moisturizer with SPF 30 (my favorite for day time) - CeraVe AM (has SPF 30) - CeraVe PM  Sunscreen  Who needs sunscreen? Everyone. Sunscreen use can help prevent skin  cancer by protecting you from the sun's harmful ultraviolet rays. Anyone can get skin cancer, regardless of age, gender or race. In fact, it is estimated that one in five Americans will develop skin cancer in their lifetime.  Sunscreen alone cannot fully protect you. In addition to wearing sunscreen, dermatologists recommend taking the following steps to protect your skin and find skin cancer early:  Seek shade when appropriate, remembering that the sun's rays are strongest between 10 a.m. and 2 p.m. If your shadow is shorter than you are, seek shade. Dress to protect yourself from the sun by wearing a lightweight long-sleeved shirt, pants, a wide-brimmed hat and sunglasses, when possible.  Use extra caution near water, snow and sand as they reflect the damaging rays of the sun, which can increase your chance of sunburn.  Get vitamin D  safely through a healthy diet that may include vitamin supplements. Don't seek the sun. Avoid tanning beds. Ultraviolet light from the sun and tanning beds can cause skin cancer and wrinkling. If you want to look tan, you may wish to use a self-tanning product, but continue to use sunscreen with it.  When should I use sunscreen? Every day you go outside--even if you're just walking to and from your form of transportation. The sun emits harmful UV rays year-round. Even on cloudy days, up to 80 percent of the sun's harmful UV rays can penetrate your skin. Snow, sand and water increase the need for sunscreen because they reflect the sun's rays.  How much sunscreen should I use, and how often should I  apply it? Most people only apply 25-50 percent of the recommended amount of sunscreen. Apply enough sunscreen to cover all exposed skin. Most adults need about 1 ounce -- or enough to fill a shot glass -- to fully cover their body.  Don't forget to apply to the tops of your feet, your neck, your ears and the top of your head. Apply sunscreen to dry skin 15 minutes before going  outdoors.  Skin cancer also can form on the lips. To protect your lips, apply a lip balm or lipstick that contains sunscreen with an SPF of 30 or higher.  When outdoors, reapply sunscreen approximately every two hours, or after swimming or sweating, according to the directions on the bottle.   Broad-spectrum sunscreens protect against both UVA and UVB rays. What is the difference between the rays? Sunlight consists of two types of harmful rays that reach the earth -- UVA rays and UVB rays. Overexposure to either can lead to skin cancer. In addition to causing skin cancer, here's what each of these rays do:  UVA rays (or aging rays) can prematurely age your skin, causing wrinkles and age spots, and can pass through window glass. UVB rays (or burning rays) are the primary cause of sunburn and are blocked by window glass  There is no safe way to tan. Every time you tan, you damage your skin. As this damage builds, you speed up the aging of your skin and increase your risk for all types of skin cancer.  What is the difference between chemical and physical sunscreens? Chemical sunscreens work like a sponge, absorbing the sun's rays. They contain one or more of the following active ingredients: oxybenzone, avobenzone, octisalate, octocrylene, homosalate and octinoxate. These formulations tend to be easier to rub into the skin without leaving a white residue.   Physical sunscreens work like a shield, sitting sit on the surface of your skin and deflecting the sun's rays. They contain the active ingredients zinc oxide and/or titanium dioxide. Use this sunscreen if you have sensitive skin.   What type of sunscreen should I use? The best type of sunscreen is the one you will use again and again. Just make sure it offers broad-spectrum (UVA and UVB) protection, has an SPF of 30+, and is water-resistant. The kind of sunscreen you use is a matter of personal choice, and may vary depending on the area of the body to  be protected. Available sunscreen options include lotions, creams, gels, ointments, wax sticks and sprays.  Recommended physical sunscreens for face: - Neutrogena Sheer Zinc - Aveeno Positively Mineral Sensitive - CeraVe Hydrating Mineral (also has a tinted version) - La Roche-Posay Anthelios Mineral Face (comes as a cream, lotion, light fluid, and there is also a tinted version).  - EltaMD UV Clear (also has a tinted version)  Recommended physical sunscreens for body: - Neutrogena Sheer Zinc Dry-Touch Sunscreen Sensitive Skin Lotion Broad Spectrum SPF 50 - Aveeno Positively Mineral Sensitive Skin Sunscreen Broad Spectrum SPF 50 - La Roche-Posay Anthelios SPF 50 Mineral Sunscreen - Gentle Lotion - CeraVe Hydrating Mineral Sunscreen SPF 50  Recommended chemical sunscreens for face: - Anthelios UV Correct Face Sunscreen SPF 70 with Niacinamide - Neutrogena Clear Face Oil-Free SPF 50 with Helioplex - Neutrogena Sport Face Oil-Free SPF 70+ with Helioplex - Aveeno Protect + Hydrate Sunscreen For Face SPF 70 - La Roche-Posay Anthelios Light Fluid Sunscreen for Face SPF 60  Recommended chemical sunscreens for body: - Neutrogena Ultra Sheer Dry-Touch Sunscreen SPF 70 -  Aveeno Protect + Hydrate Broad Spectrum All-Day Hydration SPF 60 (comes in a big pump) - La Roche-Posay Anthelios Melt-In Milk Sunscreen SPF 60  Recommended UPF Clothing - Coolibar  - Solbari  - Wallaroo hats  - Materials engineer (On [employer])     Due to recent changes in healthcare laws, you may see results of your pathology and/or laboratory studies on MyChart before the doctors have had a chance to review them. We understand that in some cases there may be results that are confusing or concerning to you. Please understand that not all results are received at the same time and often the doctors may need to interpret multiple results in order to provide you with the best plan of care or course of treatment. Therefore, we ask that you  please give us  2 business days to thoroughly review all your results before contacting the office for clarification. Should we see a critical lab result, you will be contacted sooner.   If You Need Anything After Your Visit  If you have any questions or concerns for your doctor, please call our main line at [phone] and press option 4 to reach your doctor's medical assistant. If no one answers, please leave a voicemail as directed and we will return your call as soon as possible. Messages left after 4 pm will be answered the following business day.   You may also send us  a message via MyChart. We typically respond to MyChart messages within 1-2 business days.  For prescription refills, please ask your pharmacy to contact our office. Our fax number is [phone].  If you have an urgent issue when the clinic is closed that cannot wait until the next business day, you can page your doctor at the number below.    Please note that while we do our best to be available for urgent issues outside of office hours, we are not available 24/7.   If you have an urgent issue and are unable to reach us , you may choose to seek medical care at your doctor's office, retail clinic, urgent care center, or emergency room.  If you have a medical emergency, please immediately call 911 or go to the emergency department.  Pager Numbers  - Dr. Hester: [phone]  - Dr. Jackquline: [phone]  - Dr. Claudene: [phone]   In the event of inclement weather, please call our main line at [phone] for an update on the status of any delays or closures.  Dermatology Medication Tips: Please keep the boxes that topical medications come in in order to help keep track of the instructions about where and how to use these. Pharmacies typically print the medication instructions only on the boxes and not directly on the medication tubes.   If your medication is too expensive, please contact our office at  [phone] option 4 or send us  a message through MyChart.   We are unable to tell what your co-pay for medications will be in advance as this is different depending on your insurance coverage. However, we may be able to find a substitute medication at lower cost or fill out paperwork to get insurance to cover a needed medication.   If a prior authorization is required to get your medication covered by your insurance company, please allow us  1-2 business days to complete this process.  Drug prices often vary depending on where the prescription is filled and some pharmacies may offer cheaper prices.  The website www.goodrx.com contains coupons for medications through different pharmacies. The prices  here do not account for what the cost may be with help from insurance (it may be cheaper with your insurance), but the website can give you the price if you did not use any insurance.  - You can print the associated coupon and take it with your prescription to the pharmacy.  - You may also stop by our office during regular business hours and pick up a GoodRx coupon card.  - If you need your prescription sent electronically to a different pharmacy, notify our office through [facility] or by phone at [phone] option 4.     Si Usted Necesita Algo Despus de Su Visita  Tambin puede enviarnos un mensaje a travs de Clinical cytogeneticist. Por lo general respondemos a los mensajes de MyChart en el transcurso de 1 a 2 das hbiles.  Para renovar recetas, por favor pida a su farmacia que se ponga en contacto con nuestra oficina. Randi lakes de fax es [place] [phone].  Si tiene un asunto urgente cuando la clnica est cerrada y que no puede esperar hasta el siguiente da hbil, puede llamar/localizar a su doctor(a) al nmero que aparece a continuacin.   Por favor, tenga en cuenta que aunque hacemos todo lo posible para estar disponibles para asuntos urgentes fuera del horario de [place], no estamos  disponibles las 24 horas del da, los 7 [address]  [address] de la [place].   Si tiene un problema urgente y no puede comunicarse con nosotros, puede optar por buscar atencin mdica  en el consultorio de su doctor(a), en una clnica privada, en un centro de atencin urgente o en una sala de emergencias.  Si tiene Engineer, drilling, por favor llame inmediatamente al 911 o vaya a la sala de emergencias.  Nmeros de bper  - Dr. Hester: [phone]  - Dra. Jackquline: [phone]  - Dr. Claudene: [phone]   En caso de inclemencias del tiempo, por favor llame a landry capes principal al [phone] para una actualizacin sobre el [place] de cualquier retraso o cierre.  Consejos para la medicacin en dermatologa: Por favor, guarde las cajas en las que vienen los medicamentos de uso tpico para ayudarle a seguir las instrucciones sobre dnde y cmo usarlos. Las farmacias generalmente imprimen las instrucciones del medicamento slo en las cajas y no directamente en los tubos del [place].   Si su medicamento es muy caro, por favor, pngase en contacto con landry rieger llamando al [phone] y presione la opcin 4 o envenos un mensaje a travs de Clinical cytogeneticist.   No podemos decirle cul ser su copago por los medicamentos por adelantado ya que esto es diferente dependiendo de la cobertura de su seguro. Sin embargo, es posible que podamos encontrar un medicamento sustituto a Audiological scientist un formulario para que el seguro cubra el medicamento que se considera necesario.   Si se requiere una autorizacin previa para que su compaa de seguros malta su medicamento, por favor permtanos de 1 a 2 das hbiles para completar este proceso.  Los precios de los medicamentos varan con frecuencia dependiendo del Environmental consultant de dnde se surte la receta y alguna farmacias pueden ofrecer precios ms baratos.  El sitio web www.goodrx.com tiene cupones para medicamentos de Health and safety inspector. Los precios aqu no  tienen en cuenta lo que podra costar con la ayuda del seguro (puede ser ms barato con su seguro), pero el sitio web puede darle el precio si no utiliz Tourist information centre manager.  - Puede imprimir el cupn correspondiente y llevarlo con su receta a  la farmacia.  - Tambin puede pasar por nuestra oficina durante el horario de atencin regular y Education officer, museum una tarjeta de cupones de GoodRx.  - Si necesita que su receta se enve electrnicamente a una farmacia diferente, informe a nuestra oficina a travs de MyChart de [place] o por telfono llamando al [phone] y presione la opcin 4.

## 2024-09-07 ENCOUNTER — Other Ambulatory Visit

## 2024-09-11 ENCOUNTER — Other Ambulatory Visit: Payer: Self-pay | Admitting: Internal Medicine

## 2024-09-11 DIAGNOSIS — M542 Cervicalgia: Secondary | ICD-10-CM

## 2024-09-17 ENCOUNTER — Ambulatory Visit
Admission: RE | Admit: 2024-09-17 | Discharge: 2024-09-17 | Disposition: A | Discharge status: Home / Self Care | Source: Ambulatory Visit | Attending: Internal Medicine | Admitting: Internal Medicine

## 2024-09-17 DIAGNOSIS — M542 Cervicalgia: Secondary | ICD-10-CM

## 2024-09-17 DIAGNOSIS — M5412 Radiculopathy, cervical region: Secondary | ICD-10-CM

## 2024-09-21 ENCOUNTER — Ambulatory Visit: Payer: Self-pay | Admitting: Internal Medicine

## 2024-09-27 ENCOUNTER — Encounter: Payer: Self-pay | Admitting: Internal Medicine

## 2024-09-27 ENCOUNTER — Ambulatory Visit (INDEPENDENT_AMBULATORY_CARE_PROVIDER_SITE_OTHER): Admitting: Internal Medicine

## 2024-09-27 ENCOUNTER — Ambulatory Visit: Payer: Self-pay | Admitting: Internal Medicine

## 2024-09-27 VITALS — BP 112/76 | HR 92 | Ht 64.0 in | Wt 143.6 lb

## 2024-09-27 DIAGNOSIS — Z8249 Family history of ischemic heart disease and other diseases of the circulatory system: Secondary | ICD-10-CM | POA: Diagnosis not present

## 2024-09-27 DIAGNOSIS — R300 Dysuria: Secondary | ICD-10-CM | POA: Diagnosis not present

## 2024-09-27 DIAGNOSIS — M542 Cervicalgia: Secondary | ICD-10-CM | POA: Diagnosis not present

## 2024-09-27 DIAGNOSIS — F5102 Adjustment insomnia: Secondary | ICD-10-CM

## 2024-09-27 DIAGNOSIS — F411 Generalized anxiety disorder: Secondary | ICD-10-CM | POA: Diagnosis not present

## 2024-09-27 DIAGNOSIS — Z0130 Encounter for examination of blood pressure without abnormal findings: Secondary | ICD-10-CM

## 2024-09-27 DIAGNOSIS — Z1231 Encounter for screening mammogram for malignant neoplasm of breast: Secondary | ICD-10-CM

## 2024-09-27 DIAGNOSIS — E559 Vitamin D deficiency, unspecified: Secondary | ICD-10-CM

## 2024-09-27 DIAGNOSIS — R631 Polydipsia: Secondary | ICD-10-CM | POA: Diagnosis not present

## 2024-09-27 DIAGNOSIS — K117 Disturbances of salivary secretion: Secondary | ICD-10-CM

## 2024-09-27 DIAGNOSIS — M5412 Radiculopathy, cervical region: Secondary | ICD-10-CM

## 2024-09-27 LAB — POCT URINALYSIS DIPSTICK
Bilirubin, UA: NEGATIVE
Glucose, UA: NEGATIVE
Ketones, UA: NEGATIVE
Nitrite, UA: NEGATIVE
Protein, UA: NEGATIVE
Spec Grav, UA: 1.01 (ref 1.010–1.025)
Urobilinogen, UA: 0.2 U/dL
pH, UA: 5.5 (ref 5.0–8.0)

## 2024-09-27 MED ORDER — GABAPENTIN 100 MG PO CAPS: 100.0000 mg | ORAL_CAPSULE | Freq: Every day | ORAL | 2 refills | Status: AC

## 2024-09-27 MED ORDER — ZOLPIDEM TARTRATE 5 MG PO TABS: 5.0000 mg | ORAL_TABLET | Freq: Every evening | ORAL | 0 refills | Status: AC | PRN

## 2024-09-27 MED ORDER — PAROXETINE HCL 10 MG PO TABS: 10.0000 mg | ORAL_TABLET | Freq: Every day | ORAL | 2 refills | Status: DC

## 2024-09-27 MED ORDER — BACLOFEN 10 MG PO TABS: 10.0000 mg | ORAL_TABLET | Freq: Every day | ORAL | 1 refills | Status: AC

## 2024-09-27 NOTE — Progress Notes (Signed)
 Established Patient Office Visit  Subjective:  Patient ID: Carolyn Thomas, female    DOB: September 21, 1966  Age: 58 y.o. MRN: [id number]  Chief Complaint  Patient presents with   Follow-up    Patient comes in for follow-up of her neck and left upper extremity pain with tingling and numbness.  She had an MRI done and the results shows cervical spondylosis at C4-5 and C5-6 with mild canal stenosis and moderate foraminal stenosis at both levels. Today however she is feeling better but the discomfort is not completely gone.  Patient advised to resume her gabapentin  every night, and can also take the baclofen as needed.  She has been advised to avoid high impact exercising and lifting heavy weights.  Patient reports of difficulty sleeping at night due to anxiety and also frequent urination.  Reports of drinking water throughout the night as he experiences dry mouth.  She is not sure about her family history of lupus or sicca/Sjogren syndrome.  Will check labs today as well as a UA.  Advised to start Kegel exercises irrespective of the results and also to limit her fluid intake after 7 PM if possible. Patient wanted to discuss HRT to help with her postmenopausal symptoms.  She has not had a menstrual cycle for more than 5 years.  Mentions anxiety, irritability, hot flashes, night sweats, and poor sleep.  Patient is overdue for her mammogram and Pap smear, will schedule.  She does not smoke and does not have a family history of breast cancer, endometrial cancer or ovarian cancer. Previously she was not able to tolerate Cymbalta  due to jaw pain, seen by dentist advised to stop Cymbalta  and use a mouthguard.  However will start a small dose of Paxil 10 mg at bedtime now, wear her mouthguard nightly and keep a close follow-up with her dentist for periodontal disease.  If not tolerated, will stop Paxil as well.    No other concerns at this time.   Past Medical History:  Diagnosis Date   Anxiety    Fibromyalgia  10/2016   Migraine    Stomach ulcer     Past Surgical History:  Procedure Laterality Date   CESAREAN SECTION     x2   TUBAL LIGATION      Social History   Socioeconomic History   Marital status: Married    Spouse name: Not on file   Number of children: Not on file   Years of education: Not on file   Highest education level: Not on file  Occupational History   Not on file  Tobacco Use   Smoking status: Never   Smokeless tobacco: Never  Vaping Use   Vaping status: Never Used  Substance and Sexual Activity   Alcohol use: No   Drug use: No   Sexual activity: Yes    Birth control/protection: Surgical    Comment: Tubal ligation   Other Topics Concern   Not on file  Social History Narrative   Lives in [place]. Works out regularly. Eats healthy diet. 3 children, 2 living at home. Has cat. Assists in office.   Social Drivers of Corporate Investment Banker Strain: Not on file  Food Insecurity: Not on file  Transportation Needs: Not on file  Physical Activity: Not on file  Stress: Not on file  Social Connections: Not on file  Intimate Partner Violence: Not on file    Family History  Problem Relation Age of Onset   Heart disease Father  age 60    No Known Allergies  Outpatient Medications Prior to Visit  Medication Sig   estradiol  (ESTRACE ) 0.1 MG/GM vaginal cream PLACE 1 APPLICATORFUL VAGINALLY 3 (THREE) TIMES A WEEK. (Patient not taking: Reported on 09/27/2024)   pimecrolimus  (ELIDEL ) 1 % cream APPLY TOPICALLY 2 (TWO) TIMES DAILY. APPLY TO RASH TWICE DAILY AS NEEDED (Patient not taking: Reported on 09/27/2024)   predniSONE (DELTASONE) 20 MG tablet Take 2 tablets (40 mg total) by mouth daily with breakfast. (Patient not taking: Reported on 09/27/2024)   tacrolimus (PROTOPIC) 0.1 % ointment Apply 2 grams twice daily to affected areas of skin (Patient not taking: Reported on 09/27/2024)   tretinoin  (RETIN-A ) 0.1 % cream Apply topically daily. (Patient not  taking: Reported on 09/27/2024)   [DISCONTINUED] baclofen (LIORESAL) 10 MG tablet TAKE 1 TABLET BY MOUTH EVERY DAY (Patient not taking: Reported on 09/27/2024)   [DISCONTINUED] DULoxetine  (CYMBALTA ) 30 MG capsule TAKE 1 CAPSULE BY MOUTH EVERY DAY (Patient not taking: Reported on 09/27/2024)   [DISCONTINUED] gabapentin  (NEURONTIN ) 100 MG capsule TAKE 1 CAPSULE BY MOUTH AT BEDTIME. (Patient not taking: Reported on 09/27/2024)   [DISCONTINUED] zolpidem  (AMBIEN ) 5 MG tablet Take 1 tablet (5 mg total) by mouth at bedtime as needed for sleep. (Patient not taking: Reported on 09/27/2024)   No facility-administered medications prior to visit.    Review of Systems  Constitutional: Negative.  Negative for chills, fever and malaise/fatigue.  HENT: Negative.  Negative for congestion and sore throat.   Eyes: Negative.  Negative for blurred vision and pain.  Respiratory: Negative.  Negative for cough and shortness of breath.   Cardiovascular: Negative.  Negative for chest pain, palpitations and leg swelling.  Gastrointestinal: Negative.  Negative for abdominal pain, blood in stool, constipation, diarrhea, heartburn, melena, nausea and vomiting.  Genitourinary: Negative.  Negative for dysuria, flank pain, frequency and urgency.  Musculoskeletal:  Positive for neck pain. Negative for myalgias.  Skin: Negative.   Neurological: Negative.  Negative for dizziness, tingling, sensory change, weakness and headaches.  Endo/Heme/Allergies: Negative.   Psychiatric/Behavioral: Negative.  Negative for depression and suicidal ideas. The patient is not nervous/anxious.        Objective:   BP 112/76   Pulse 92   Ht 5' 4 (1.626 m)   Wt 143 lb 9.6 oz (65.1 kg)   SpO2 99%   BMI 24.65 kg/m   Vitals:   09/27/24 1443  BP: 112/76  Pulse: 92  Height: 5' 4 (1.626 m)  Weight: 143 lb 9.6 oz (65.1 kg)  SpO2: 99%  BMI (Calculated): 24.64    Physical Exam Vitals and nursing note reviewed.  Constitutional:       Appearance: Normal appearance.  HENT:     Head: Normocephalic and atraumatic.     Nose: Nose normal.     Mouth/Throat:     Mouth: Mucous membranes are moist.     Pharynx: Oropharynx is clear.  Eyes:     Conjunctiva/sclera: Conjunctivae normal.     Pupils: Pupils are equal, round, and reactive to light.  Cardiovascular:     Rate and Rhythm: Normal rate and regular rhythm.     Pulses: Normal pulses.     Heart sounds: Normal heart sounds. No murmur heard. Pulmonary:     Effort: Pulmonary effort is normal.     Breath sounds: Normal breath sounds. No wheezing.  Abdominal:     General: Bowel sounds are normal.     Palpations: Abdomen is soft.  Tenderness: There is no abdominal tenderness. There is no right CVA tenderness or left CVA tenderness.  Musculoskeletal:        General: Normal range of motion.     Cervical back: Normal range of motion.     Right lower leg: No edema.     Left lower leg: No edema.  Skin:    General: Skin is warm and dry.  Neurological:     General: No focal deficit present.     Mental Status: She is alert and oriented to person, place, and time.  Psychiatric:        Mood and Affect: Mood normal.        Behavior: Behavior normal.      Results for orders placed or performed in visit on 09/27/24  POCT Urinalysis Dipstick (81002)  Result Value Ref Range   Color, UA Yellow    Clarity, UA Cloudy    Glucose, UA Negative Negative   Bilirubin, UA Negative    Ketones, UA Negative    Spec Grav, UA 1.010 1.010 - 1.025   Blood, UA Trace    pH, UA 5.5 5.0 - 8.0   Protein, UA Negative Negative   Urobilinogen, UA 0.2 0.2 or 1.0 E.U./dL   Nitrite, UA Negative    Leukocytes, UA Small (1+) (A) Negative   Appearance Cloudy    Odor Yes     Recent Results (from the past 2160 hours)  POCT Urinalysis Dipstick (18997)     Status: Abnormal   Collection Time: 09/27/24  3:28 PM  Result Value Ref Range   Color, UA Yellow    Clarity, UA Cloudy    Glucose, UA  Negative Negative   Bilirubin, UA Negative    Ketones, UA Negative    Spec Grav, UA 1.010 1.010 - 1.025   Blood, UA Trace    pH, UA 5.5 5.0 - 8.0   Protein, UA Negative Negative   Urobilinogen, UA 0.2 0.2 or 1.0 E.U./dL   Nitrite, UA Negative    Leukocytes, UA Small (1+) (A) Negative   Appearance Cloudy    Odor Yes       Assessment & Plan:  Prescription sent to resume gabapentin  at night, baclofen as needed, Ambien  as needed and Paxil at night.  Monitor for periodontal disease and xerostomia.  Check labs today. Patient will avoid strenuous high impact exercises.  If her symptoms of cervical radiculopathy gets worse, will refer to spine surgeon. Schedule mammogram and a Pap.  May consider HRT therapy. Problem List Items Addressed This Visit     Insomnia   Relevant Medications   zolpidem  (AMBIEN ) 5 MG tablet   GAD (generalized anxiety disorder)   Relevant Medications   PARoxetine (PAXIL) 10 MG tablet   Other Relevant Orders   Lipid Panel w/o Chol/HDL Ratio   CBC with Diff   Neck pain - Primary   Relevant Orders   Arthritis Panel   Other Visit Diagnoses       Breast cancer screening by mammogram       Relevant Orders   MM 3D SCREENING MAMMOGRAM BILATERAL BREAST     Family history of early CAD         Polydipsia       Relevant Orders   CMP14+EGFR     Dysuria       Relevant Orders   POCT Urinalysis Dipstick (18997) (Completed)   Urine Culture     Cervicalgia       Relevant Medications  baclofen (LIORESAL) 10 MG tablet   gabapentin  (NEURONTIN ) 100 MG capsule     Cervical radiculopathy       Relevant Medications   baclofen (LIORESAL) 10 MG tablet   gabapentin  (NEURONTIN ) 100 MG capsule   zolpidem  (AMBIEN ) 5 MG tablet   PARoxetine (PAXIL) 10 MG tablet     Xerostomia       Relevant Orders   ANA, IFA (with reflex)       Return in about 1 month (around 10/27/2024).   Total time spent: 30 minutes. This time includes review of previous notes and results and  patient face to face interaction during today's visit.    FERNAND FREDY RAMAN, MD  09/27/2024   This document may have been prepared by [facility] Voice Recognition software and as such may include unintentional dictation errors.

## 2024-09-28 ENCOUNTER — Other Ambulatory Visit: Payer: Self-pay | Admitting: Internal Medicine

## 2024-09-28 DIAGNOSIS — E559 Vitamin D deficiency, unspecified: Secondary | ICD-10-CM

## 2024-09-28 DIAGNOSIS — K117 Disturbances of salivary secretion: Secondary | ICD-10-CM

## 2024-09-28 LAB — CBC WITH DIFFERENTIAL/PLATELET
Basophils Absolute: 0.1 x10E3/uL (ref 0.0–0.2)
Basos: 1 %
EOS (ABSOLUTE): 0.3 x10E3/uL (ref 0.0–0.4)
Eos: 3 %
Hematocrit: 41.5 % (ref 34.0–46.6)
Hemoglobin: 13.5 g/dL (ref 11.1–15.9)
Immature Grans (Abs): 0 x10E3/uL (ref 0.0–0.1)
Immature Granulocytes: 0 %
Lymphocytes Absolute: 4.7 x10E3/uL — ABNORMAL HIGH (ref 0.7–3.1)
Lymphs: 43 %
MCH: 29.2 pg (ref 26.6–33.0)
MCHC: 32.5 g/dL (ref 31.5–35.7)
MCV: 90 fL (ref 79–97)
Monocytes Absolute: 0.6 x10E3/uL (ref 0.1–0.9)
Monocytes: 5 %
Neutrophils Absolute: 5.2 x10E3/uL (ref 1.4–7.0)
Neutrophils: 48 %
Platelets: 344 x10E3/uL (ref 150–450)
RBC: 4.62 x10E6/uL (ref 3.77–5.28)
RDW: 13.1 % (ref 11.7–15.4)
WBC: 10.9 x10E3/uL — ABNORMAL HIGH (ref 3.4–10.8)

## 2024-09-28 LAB — CMP14+EGFR
ALT: 15 IU/L (ref 0–32)
AST: 25 IU/L (ref 0–40)
Albumin: 4.5 g/dL (ref 3.8–4.9)
Alkaline Phosphatase: 89 IU/L (ref 49–135)
BUN/Creatinine Ratio: 13 (ref 9–23)
BUN: 11 mg/dL (ref 6–24)
Bilirubin Total: 0.8 mg/dL (ref 0.0–1.2)
CO2: 25 mmol/L (ref 20–29)
Calcium: 9.8 mg/dL (ref 8.7–10.2)
Chloride: 99 mmol/L (ref 96–106)
Creatinine, Ser: 0.82 mg/dL (ref 0.57–1.00)
Globulin, Total: 2.2 g/dL (ref 1.5–4.5)
Glucose: 82 mg/dL (ref 70–99)
Potassium: 4 mmol/L (ref 3.5–5.2)
Sodium: 138 mmol/L (ref 134–144)
Total Protein: 6.7 g/dL (ref 6.0–8.5)
eGFR: 83 mL/min/1.73 (ref >59)

## 2024-09-28 LAB — LIPID PANEL W/O CHOL/HDL RATIO
Cholesterol, Total: 267 mg/dL — ABNORMAL HIGH (ref 100–199)
HDL: 66 mg/dL (ref >39)
LDL Chol Calc (NIH): 184 mg/dL — ABNORMAL HIGH (ref 0–99)
Triglycerides: 98 mg/dL (ref 0–149)
VLDL Cholesterol Cal: 17 mg/dL (ref 5–40)

## 2024-09-28 LAB — ARTHRITIS PANEL
Anti Nuclear Antibody (ANA): POSITIVE — AB
Rheumatoid fact SerPl-aCnc: <10 [IU]/mL (ref <14.0)
Sed Rate: 7 mm/h (ref 0–40)
Uric Acid: 6 mg/dL (ref 3.0–7.2)

## 2024-09-29 ENCOUNTER — Other Ambulatory Visit: Payer: Self-pay | Admitting: Internal Medicine

## 2024-09-29 LAB — SJOGREN'S SYNDROME ANTIBODS(SSA + SSB)
ENA SSA (RO) Ab: 1.2 AI — ABNORMAL HIGH (ref 0.0–0.9)
ENA SSB (LA) Ab: <0.2 AI (ref 0.0–0.9)

## 2024-09-29 LAB — ANTINUCLEAR ANTIBODIES, IFA: ANA Titer 1: NEGATIVE

## 2024-09-29 LAB — VITAMIN D 25 HYDROXY (VIT D DEFICIENCY, FRACTURES): Vit D, 25-Hydroxy: 15.4 ng/mL — ABNORMAL LOW (ref 30.0–100.0)

## 2024-09-29 LAB — SPECIMEN STATUS REPORT

## 2024-09-29 LAB — URINE CULTURE

## 2024-09-29 MED ORDER — ROSUVASTATIN CALCIUM 20 MG PO TABS: 20.0000 mg | ORAL_TABLET | Freq: Every day | ORAL | 3 refills | Status: AC

## 2024-09-30 MED ORDER — VITAMIN D3 1.25 MG (50000 UT) PO CAPS
1.0000 | ORAL_CAPSULE | ORAL | 3 refills | Status: AC
Start: 2024-09-30 — End: ?

## 2024-09-30 NOTE — Progress Notes (Signed)
 Patient notified

## 2024-10-19 ENCOUNTER — Ambulatory Visit

## 2024-10-20 ENCOUNTER — Other Ambulatory Visit: Payer: Self-pay | Admitting: Internal Medicine

## 2024-10-20 DIAGNOSIS — F411 Generalized anxiety disorder: Secondary | ICD-10-CM

## 2024-10-26 ENCOUNTER — Ambulatory Visit

## 2024-10-29 ENCOUNTER — Other Ambulatory Visit: Payer: Self-pay | Admitting: Internal Medicine

## 2024-10-29 ENCOUNTER — Ambulatory Visit: Admitting: Internal Medicine

## 2024-10-29 ENCOUNTER — Ambulatory Visit: Payer: Self-pay | Admitting: Internal Medicine

## 2024-10-29 ENCOUNTER — Encounter: Payer: Self-pay | Admitting: Internal Medicine

## 2024-10-29 VITALS — BP 110/78 | HR 67 | Ht 64.0 in | Wt 146.6 lb

## 2024-10-29 DIAGNOSIS — Z0000 Encounter for general adult medical examination without abnormal findings: Secondary | ICD-10-CM | POA: Insufficient documentation

## 2024-10-29 DIAGNOSIS — E663 Overweight: Secondary | ICD-10-CM

## 2024-10-29 DIAGNOSIS — Z1272 Encounter for screening for malignant neoplasm of vagina: Secondary | ICD-10-CM | POA: Diagnosis not present

## 2024-10-29 DIAGNOSIS — Z202 Contact with and (suspected) exposure to infections with a predominantly sexual mode of transmission: Secondary | ICD-10-CM | POA: Insufficient documentation

## 2024-10-29 DIAGNOSIS — Z1389 Encounter for screening for other disorder: Secondary | ICD-10-CM | POA: Insufficient documentation

## 2024-10-29 DIAGNOSIS — Z6825 Body mass index (BMI) 25.0-25.9, adult: Secondary | ICD-10-CM | POA: Insufficient documentation

## 2024-10-29 DIAGNOSIS — N760 Acute vaginitis: Secondary | ICD-10-CM | POA: Diagnosis not present

## 2024-10-29 DIAGNOSIS — Z1151 Encounter for screening for human papillomavirus (HPV): Secondary | ICD-10-CM | POA: Diagnosis not present

## 2024-10-29 DIAGNOSIS — Z0001 Encounter for general adult medical examination with abnormal findings: Secondary | ICD-10-CM

## 2024-10-29 DIAGNOSIS — Z124 Encounter for screening for malignant neoplasm of cervix: Secondary | ICD-10-CM | POA: Insufficient documentation

## 2024-10-29 LAB — POCT URINALYSIS DIPSTICK
Bilirubin, UA: NEGATIVE
Blood, UA: NEGATIVE
Glucose, UA: NEGATIVE
Nitrite, UA: NEGATIVE
Protein, UA: NEGATIVE
Spec Grav, UA: 1.02
Urobilinogen, UA: 0.2 U/dL
pH, UA: 6

## 2024-10-29 NOTE — Progress Notes (Signed)
 "  Established Patient Office Visit  Subjective:  Patient ID: Carolyn Thomas, female    DOB: [birth year]  Age: 58 y.o. MRN: [id number]  Chief Complaint  Patient presents with   Annual Exam    CPE, PAP    Patient is here today for her routine physical and pap smear. She reports feeling well at this time and has no new concerns to discuss.  She had blood work completed recently and was started on cholesterol medication. She reports taking all her medications as prescribed. She will be due for fasting labs again 12/2024.  Her mammogram has been scheduled for 11/2024. Will discuss HRT after pap smear results come back and mammogram is completed. Repeat UA today still shows some pus cells. Will send for culture. Previous urine culture showed mixed flora. Defer antibiotics until culture results at this time.    No other concerns at this time.   Past Medical History:  Diagnosis Date   Anxiety    Fibromyalgia 10/2016   Migraine    Stomach ulcer     Past Surgical History:  Procedure Laterality Date   CESAREAN SECTION     x2   TUBAL LIGATION      Social History   Socioeconomic History   Marital status: Married    Spouse name: Not on file   Number of children: Not on file   Years of education: Not on file   Highest education level: Not on file  Occupational History   Not on file  Tobacco Use   Smoking status: Never   Smokeless tobacco: Never  Vaping Use   Vaping status: Never Used  Substance and Sexual Activity   Alcohol use: No   Drug use: No   Sexual activity: Yes    Birth control/protection: Surgical    Comment: Tubal ligation   Other Topics Concern   Not on file  Social History Narrative   Lives in [place]. Works out regularly. Eats healthy diet. 3 children, 2 living at home. Has cat. Assists in office.   Social Drivers of Health   Tobacco Use: Low Risk (10/29/2024)   Patient History    Smoking Tobacco Use: Never    Smokeless Tobacco Use: Never    Passive  Exposure: Not on file  Financial Resource Strain: Not on file  Food Insecurity: No Food Insecurity (10/29/2024)   Epic    Worried About Programme Researcher, Broadcasting/film/video in the Last Year: Never true    Ran Out of Food in the Last Year: Never true  Transportation Needs: No Transportation Needs (10/29/2024)   Epic    Lack of Transportation (Medical): No    Lack of Transportation (Non-Medical): No  Physical Activity: Not on file  Stress: Not on file  Social Connections: Not on file  Intimate Partner Violence: Not on file  Depression [phone]): Low Risk (10/29/2024)   Depression (PHQ2-9)    PHQ-2 Score: 0  Alcohol Screen: Low Risk (10/29/2024)   Alcohol Screen    Last Alcohol Screening Score (AUDIT): 0  Housing: Unknown (10/29/2024)   Epic    Unable to Pay for Housing in the Last Year: No    Number of Times Moved in the Last Year: Not on file    Homeless in the Last Year: No  Utilities: Not At Risk (10/29/2024)   Epic    Threatened with loss of utilities: No  Health Literacy: Adequate Health Literacy (10/29/2024)   B1300 Health Literacy    Frequency of need for help  with medical instructions: Never    Family History  Problem Relation Age of Onset   Heart disease Father        age 81    Allergies[1]  Show/hide medication list[2]  Review of Systems  Constitutional: Negative.  Negative for chills, fever and malaise/fatigue.  HENT: Negative.  Negative for congestion and sore throat.   Eyes: Negative.  Negative for blurred vision and pain.  Respiratory: Negative.  Negative for cough and shortness of breath.   Cardiovascular: Negative.  Negative for chest pain, palpitations and leg swelling.  Gastrointestinal: Negative.  Negative for abdominal pain, blood in stool, constipation, diarrhea, heartburn, melena, nausea and vomiting.  Genitourinary: Negative.  Negative for dysuria, flank pain, frequency and urgency.  Musculoskeletal: Negative.  Negative for joint pain and myalgias.  Skin:  Negative.   Neurological: Negative.  Negative for dizziness, tingling, sensory change, weakness and headaches.  Endo/Heme/Allergies: Negative.   Psychiatric/Behavioral: Negative.  Negative for depression and suicidal ideas. The patient is not nervous/anxious.        Objective:   BP 110/78   Pulse 67   Ht 5' 4 (1.626 m)   Wt 146 lb 9.6 oz (66.5 kg)   SpO2 97%   BMI 25.16 kg/m   Vitals:   10/29/24 1453  BP: 110/78  Pulse: 67  Height: 5' 4 (1.626 m)  Weight: 146 lb 9.6 oz (66.5 kg)  SpO2: 97%  BMI (Calculated): 25.15    Physical Exam Vitals and nursing note reviewed. Exam conducted with a chaperone present.  Constitutional:      Appearance: Normal appearance.  HENT:     Head: Normocephalic and atraumatic.     Nose: Nose normal.     Mouth/Throat:     Mouth: Mucous membranes are moist.     Pharynx: Oropharynx is clear.  Eyes:     Conjunctiva/sclera: Conjunctivae normal.     Pupils: Pupils are equal, round, and reactive to light.  Cardiovascular:     Rate and Rhythm: Normal rate and regular rhythm.     Pulses: Normal pulses.     Heart sounds: Normal heart sounds. No murmur heard. Pulmonary:     Effort: Pulmonary effort is normal.     Breath sounds: Normal breath sounds. No wheezing.  Chest:  Breasts:    Right: Normal. No swelling, bleeding, inverted nipple, mass, nipple discharge, skin change or tenderness.     Left: Normal. No swelling, bleeding, inverted nipple, mass, nipple discharge, skin change or tenderness.  Abdominal:     General: Bowel sounds are normal.     Palpations: Abdomen is soft.     Tenderness: There is no abdominal tenderness. There is no right CVA tenderness or left CVA tenderness.     Hernia: There is no hernia in the left inguinal area or right inguinal area.  Genitourinary:    General: Normal vulva.     Pubic Area: No rash or pubic lice.      Labia:        Right: No rash, tenderness, lesion or injury.        Left: No rash, tenderness,  lesion or injury.      Urethra: No prolapse.     Vagina: Normal. No signs of injury and foreign body. No vaginal discharge, erythema, tenderness, bleeding, lesions or prolapsed vaginal walls.     Cervix: Normal.     Uterus: Normal.      Adnexa: Right adnexa normal and left adnexa normal.  Right: No mass, tenderness or fullness.         Left: No mass, tenderness or fullness.    Musculoskeletal:        General: Normal range of motion.     Cervical back: Normal range of motion.     Right lower leg: No edema.     Left lower leg: No edema.  Lymphadenopathy:     Upper Body:     Right upper body: No supraclavicular, axillary or pectoral adenopathy.     Left upper body: No supraclavicular, axillary or pectoral adenopathy.     Lower Body: No right inguinal adenopathy. No left inguinal adenopathy.  Skin:    General: Skin is warm and dry.  Neurological:     General: No focal deficit present.     Mental Status: She is alert and oriented to person, place, and time.  Psychiatric:        Mood and Affect: Mood normal.        Behavior: Behavior normal.      Results for orders placed or performed in visit on 10/29/24  POCT Urinalysis Dipstick (81002)  Result Value Ref Range   Color, UA Yellow    Clarity, UA Clear    Glucose, UA Negative Negative   Bilirubin, UA Negative    Ketones, UA Trace    Spec Grav, UA 1.020 1.010 - 1.025   Blood, UA Negative    pH, UA 6.0 5.0 - 8.0   Protein, UA Negative Negative   Urobilinogen, UA 0.2 0.2 or 1.0 E.U./dL   Nitrite, UA Negative    Leukocytes, UA Small (1+) (A) Negative   Appearance Clear    Odor Yes     Recent Results (from the past 2160 hours)  POCT Urinalysis Dipstick (18997)     Status: Abnormal   Collection Time: 09/27/24  3:28 PM  Result Value Ref Range   Color, UA Yellow    Clarity, UA Cloudy    Glucose, UA Negative Negative   Bilirubin, UA Negative    Ketones, UA Negative    Spec Grav, UA 1.010 1.010 - 1.025   Blood, UA Trace     pH, UA 5.5 5.0 - 8.0   Protein, UA Negative Negative   Urobilinogen, UA 0.2 0.2 or 1.0 E.U./dL   Nitrite, UA Negative    Leukocytes, UA Small (1+) (A) Negative   Appearance Cloudy    Odor Yes   ANA, IFA (with reflex)     Status: None   Collection Time: 09/27/24  3:40 PM  Result Value Ref Range   ANA Titer 1 Negative     Comment:                                      Negative   <1:80                                      Borderline  1:80                                      Positive   >1:80 ICAP nomenclature: AC-0 For more information about Hep-2 cell patterns use ANApatterns.org, the official website for the International Consensus on Antinuclear Antibody (ANA) Patterns (ICAP).  CMP14+EGFR     Status: None   Collection Time: 09/27/24  3:41 PM  Result Value Ref Range   Glucose 82 70 - 99 mg/dL   BUN 11 6 - 24 mg/dL   Creatinine, Ser 9.17 0.57 - 1.00 mg/dL   eGFR 83 >40 fO/fpw/8.26   BUN/Creatinine Ratio 13 9 - 23   Sodium 138 134 - 144 mmol/L   Potassium 4.0 3.5 - 5.2 mmol/L   Chloride 99 96 - 106 mmol/L   CO2 25 20 - 29 mmol/L   Calcium  9.8 8.7 - 10.2 mg/dL   Total Protein 6.7 6.0 - 8.5 g/dL   Albumin 4.5 3.8 - 4.9 g/dL   Globulin, Total 2.2 1.5 - 4.5 g/dL   Bilirubin Total 0.8 0.0 - 1.2 mg/dL   Alkaline Phosphatase 89 49 - 135 IU/L   AST 25 0 - 40 IU/L   ALT 15 0 - 32 IU/L  Lipid Panel w/o Chol/HDL Ratio     Status: Abnormal   Collection Time: 09/27/24  3:41 PM  Result Value Ref Range   Cholesterol, Total 267 (H) 100 - 199 mg/dL   Triglycerides 98 0 - 149 mg/dL   HDL 66 >60 mg/dL   VLDL Cholesterol Cal 17 5 - 40 mg/dL   LDL Chol Calc (NIH) 815 (H) 0 - 99 mg/dL  CBC with Diff     Status: Abnormal   Collection Time: 09/27/24  3:41 PM  Result Value Ref Range   WBC 10.9 (H) 3.4 - 10.8 x10E3/uL   RBC 4.62 3.77 - 5.28 x10E6/uL   Hemoglobin 13.5 11.1 - 15.9 g/dL   Hematocrit 58.4 65.9 - 46.6 %   MCV 90 79 - 97 fL   MCH 29.2 26.6 - 33.0 pg   MCHC 32.5 31.5 - 35.7  g/dL   RDW 86.8 88.2 - 84.5 %   Platelets 344 150 - 450 x10E3/uL   Neutrophils 48 Not Estab. %   Lymphs 43 Not Estab. %   Monocytes 5 Not Estab. %   Eos 3 Not Estab. %   Basos 1 Not Estab. %   Neutrophils Absolute 5.2 1.4 - 7.0 x10E3/uL   Lymphocytes Absolute 4.7 (H) 0.7 - 3.1 x10E3/uL   Monocytes Absolute 0.6 0.1 - 0.9 x10E3/uL   EOS (ABSOLUTE) 0.3 0.0 - 0.4 x10E3/uL   Basophils Absolute 0.1 0.0 - 0.2 x10E3/uL   Immature Granulocytes 0 Not Estab. %   Immature Grans (Abs) 0.0 0.0 - 0.1 x10E3/uL  Arthritis Panel     Status: Abnormal   Collection Time: 09/27/24  3:41 PM  Result Value Ref Range   Uric Acid 6.0 3.0 - 7.2 mg/dL    Comment:            Therapeutic target for gout patients: <6.0   Anti Nuclear Antibody (ANA) Positive (A) Negative   Rheumatoid fact SerPl-aCnc <10.0 <14.0 IU/mL   Sed Rate 7 0 - 40 mm/hr  Sjogren's syndrome antibods(ssa + ssb)     Status: Abnormal   Collection Time: 09/27/24  3:41 PM  Result Value Ref Range   ENA SSA (RO) Ab 1.2 (H) 0.0 - 0.9 AI   ENA SSB (LA) Ab <0.2 0.0 - 0.9 AI  VITAMIN D  25 Hydroxy (Vit-D Deficiency, Fractures)     Status: Abnormal   Collection Time: 09/27/24  3:41 PM  Result Value Ref Range   Vit D, 25-Hydroxy 15.4 (L) 30.0 - 100.0 ng/mL    Comment: Vitamin D  deficiency has been defined  by the Institute of Medicine and an Endocrine Society practice guideline as a level of serum 25-OH vitamin D  less than 20 ng/mL (1,2). The Endocrine Society went on to further define vitamin D  insufficiency as a level between 21 and 29 ng/mL (2). 1. IOM (Institute of Medicine). 2010. Dietary reference    intakes for calcium  and D. [place]: The    [employer]. 2. Holick MF, Binkley Why, Bischoff-Ferrari HA, et al.    Evaluation, treatment, and prevention of vitamin D     deficiency: an Endocrine Society clinical practice    guideline. JCEM. 2011 Jul; 96(7):1911-30.   Specimen status report     Status: None   Collection Time:  09/27/24  3:41 PM  Result Value Ref Range   specimen status report Comment     Comment: Written Authorization Written Authorization Written Authorization Received. Authorization received from [facility] 09-28-2024 Logged by Joesph Ion   Urine Culture     Status: None   Collection Time: 09/27/24  3:51 PM   Specimen: Urine   UR  Result Value Ref Range   Urine Culture, Routine Final report    Organism ID, Bacteria Comment     Comment: Mixed urogenital flora Less than 10,000 colonies/mL   POCT Urinalysis Dipstick (18997)     Status: Abnormal   Collection Time: 10/29/24  3:05 PM  Result Value Ref Range   Color, UA Yellow    Clarity, UA Clear    Glucose, UA Negative Negative   Bilirubin, UA Negative    Ketones, UA Trace    Spec Grav, UA 1.020 1.010 - 1.025   Blood, UA Negative    pH, UA 6.0 5.0 - 8.0   Protein, UA Negative Negative   Urobilinogen, UA 0.2 0.2 or 1.0 E.U./dL   Nitrite, UA Negative    Leukocytes, UA Small (1+) (A) Negative   Appearance Clear    Odor Yes       Assessment & Plan:  Pap smear completed today. Patient not due for routine blood work at this time. Continue taking medications as prescribed. Reinforced healthy diet and exercise as tolerated. Mammogram is scheduled; get that completed. UA shows pus cells will send for culture and defer antibiotics at this time. Problem List Items Addressed This Visit       Genitourinary   Acute vaginitis   Relevant Orders   IGP,CtNgTv,Apt HPV,rfx16/18,45   Urine Culture     Other   Screening for blood or protein in urine   Relevant Orders   POCT Urinalysis Dipstick (18997) (Completed)   Vaginal Pap smear   Relevant Orders   IGP,CtNgTv,Apt HPV,rfx16/18,45   Exposure to sexually transmitted disease (STD)   Relevant Orders   IGP,CtNgTv,Apt HPV,rfx16/18,45   Screening for human papillomavirus (HPV)   Relevant Orders   IGP,CtNgTv,Apt HPV,rfx16/18,45   Screening for cervical cancer   Relevant Orders    IGP,CtNgTv,Apt HPV,rfx16/18,45   Overweight with body mass index (BMI) of 25 to 25.9 in adult   Health maintenance examination - Primary    No follow-ups on file.   Total time spent: 30 minutes. This time includes review of previous notes and results and patient face to face interaction during today's visit.    FERNAND FREDY RAMAN, MD  10/29/2024   This document may have been prepared by [facility] Voice Recognition software and as such may include unintentional dictation errors.      [1] No Known Allergies [2]  Outpatient Medications Prior to Visit  Medication Sig  baclofen  (LIORESAL ) 10 MG tablet Take 1 tablet (10 mg total) by mouth daily.   Cholecalciferol (VITAMIN D3) 1.25 MG (50000 UT) CAPS Take 1 capsule (1.25 mg total) by mouth once a week.   estradiol  (ESTRACE ) 0.1 MG/GM vaginal cream PLACE 1 APPLICATORFUL VAGINALLY 3 (THREE) TIMES A WEEK.   gabapentin  (NEURONTIN ) 100 MG capsule Take 1 capsule (100 mg total) by mouth at bedtime.   PARoxetine  (PAXIL ) 10 MG tablet TAKE 1 TABLET BY MOUTH EVERY DAY   rosuvastatin  (CRESTOR ) 20 MG tablet Take 1 tablet (20 mg total) by mouth daily.   zolpidem  (AMBIEN ) 5 MG tablet Take 1 tablet (5 mg total) by mouth at bedtime as needed for sleep.   pimecrolimus  (ELIDEL ) 1 % cream APPLY TOPICALLY 2 (TWO) TIMES DAILY. APPLY TO RASH TWICE DAILY AS NEEDED (Patient not taking: Reported on 10/29/2024)   predniSONE  (DELTASONE ) 20 MG tablet Take 2 tablets (40 mg total) by mouth daily with breakfast. (Patient not taking: Reported on 10/29/2024)   tacrolimus  (PROTOPIC ) 0.1 % ointment Apply 2 grams twice daily to affected areas of skin (Patient not taking: Reported on 10/29/2024)   tretinoin  (RETIN-A ) 0.1 % cream Apply topically daily. (Patient not taking: Reported on 10/29/2024)   No facility-administered medications prior to visit.   "

## 2024-11-02 ENCOUNTER — Ambulatory Visit: Payer: Self-pay | Admitting: Internal Medicine

## 2024-11-02 LAB — IGP,CTNGTV,APT HPV,RFX16/18,45
Chlamydia, Nuc. Acid Amp: NEGATIVE
Gonococcus, Nuc. Acid Amp: NEGATIVE
HPV Aptima: NEGATIVE
PAP Smear Comment: 0
Trich vag by NAA: NEGATIVE

## 2024-11-02 LAB — URINE CULTURE

## 2024-11-09 NOTE — Progress Notes (Signed)
 Pt informed

## 2024-11-16 ENCOUNTER — Ambulatory Visit
Admission: RE | Admit: 2024-11-16 | Discharge: 2024-11-16 | Disposition: A | Discharge status: Home / Self Care | Source: Ambulatory Visit | Attending: Internal Medicine | Admitting: Internal Medicine

## 2024-11-16 DIAGNOSIS — Z1231 Encounter for screening mammogram for malignant neoplasm of breast: Secondary | ICD-10-CM | POA: Diagnosis present

## 2024-11-19 ENCOUNTER — Inpatient Hospital Stay
Admission: RE | Admit: 2024-11-19 | Discharge: 2024-11-19 | Disposition: A | Payer: Self-pay | Source: Ambulatory Visit | Attending: Internal Medicine | Admitting: Internal Medicine

## 2024-11-19 ENCOUNTER — Other Ambulatory Visit: Payer: Self-pay | Admitting: *Deleted

## 2024-11-19 DIAGNOSIS — Z1231 Encounter for screening mammogram for malignant neoplasm of breast: Secondary | ICD-10-CM

## 2025-01-27 ENCOUNTER — Ambulatory Visit: Admitting: Internal Medicine
